# Patient Record
Sex: Female | Born: 1965 | Race: Black or African American | Hispanic: No | Marital: Married | Smoking: Never smoker
Health system: Southern US, Community
[De-identification: ages and names within clinical notes are randomized; demographics above are authoritative.]

## PROBLEM LIST (undated history)

## (undated) DIAGNOSIS — K259 Gastric ulcer, unspecified as acute or chronic, without hemorrhage or perforation: Secondary | ICD-10-CM

## (undated) DIAGNOSIS — IMO0002 Reserved for concepts with insufficient information to code with codable children: Secondary | ICD-10-CM

## (undated) DIAGNOSIS — K219 Gastro-esophageal reflux disease without esophagitis: Secondary | ICD-10-CM

## (undated) HISTORY — PX: TUBAL LIGATION: SHX77

---

## 1997-08-17 ENCOUNTER — Emergency Department (HOSPITAL_COMMUNITY): Admission: EM | Admit: 1997-08-17 | Discharge: 1997-08-17 | Payer: Self-pay | Admitting: Emergency Medicine

## 1997-09-17 ENCOUNTER — Emergency Department (HOSPITAL_COMMUNITY): Admission: EM | Admit: 1997-09-17 | Discharge: 1997-09-17 | Payer: Self-pay | Admitting: Emergency Medicine

## 1998-03-18 ENCOUNTER — Emergency Department (HOSPITAL_COMMUNITY): Admission: EM | Admit: 1998-03-18 | Discharge: 1998-03-18 | Payer: Self-pay | Admitting: Emergency Medicine

## 1999-10-03 ENCOUNTER — Emergency Department (HOSPITAL_COMMUNITY): Admission: EM | Admit: 1999-10-03 | Discharge: 1999-10-03 | Payer: Self-pay | Admitting: Emergency Medicine

## 1999-10-04 ENCOUNTER — Emergency Department (HOSPITAL_COMMUNITY): Admission: EM | Admit: 1999-10-04 | Discharge: 1999-10-04 | Payer: Self-pay | Admitting: Emergency Medicine

## 1999-10-08 ENCOUNTER — Inpatient Hospital Stay (HOSPITAL_COMMUNITY): Admission: EM | Admit: 1999-10-08 | Discharge: 1999-10-08 | Payer: Self-pay | Admitting: Emergency Medicine

## 1999-10-09 ENCOUNTER — Encounter: Admission: RE | Admit: 1999-10-09 | Discharge: 1999-10-09 | Payer: Self-pay | Admitting: Internal Medicine

## 1999-10-12 ENCOUNTER — Encounter: Admission: RE | Admit: 1999-10-12 | Discharge: 1999-10-12 | Payer: Self-pay | Admitting: Internal Medicine

## 1999-11-05 ENCOUNTER — Encounter: Admission: RE | Admit: 1999-11-05 | Discharge: 1999-11-05 | Payer: Self-pay | Admitting: Hematology and Oncology

## 1999-11-19 ENCOUNTER — Encounter: Admission: RE | Admit: 1999-11-19 | Discharge: 1999-11-19 | Payer: Self-pay | Admitting: Hematology and Oncology

## 2000-02-18 ENCOUNTER — Encounter: Admission: RE | Admit: 2000-02-18 | Discharge: 2000-02-18 | Payer: Self-pay | Admitting: Hematology and Oncology

## 2000-05-12 ENCOUNTER — Other Ambulatory Visit: Admission: RE | Admit: 2000-05-12 | Discharge: 2000-05-12 | Payer: Self-pay | Admitting: Internal Medicine

## 2000-05-12 ENCOUNTER — Encounter: Admission: RE | Admit: 2000-05-12 | Discharge: 2000-05-12 | Payer: Self-pay | Admitting: Internal Medicine

## 2000-07-21 ENCOUNTER — Encounter: Admission: RE | Admit: 2000-07-21 | Discharge: 2000-07-21 | Payer: Self-pay | Admitting: Hematology and Oncology

## 2001-04-04 ENCOUNTER — Emergency Department (HOSPITAL_COMMUNITY): Admission: EM | Admit: 2001-04-04 | Discharge: 2001-04-04 | Payer: Self-pay

## 2001-08-29 ENCOUNTER — Emergency Department (HOSPITAL_COMMUNITY): Admission: EM | Admit: 2001-08-29 | Discharge: 2001-08-29 | Payer: Self-pay | Admitting: Emergency Medicine

## 2001-09-25 ENCOUNTER — Encounter: Admission: RE | Admit: 2001-09-25 | Discharge: 2001-09-25 | Payer: Self-pay | Admitting: Internal Medicine

## 2001-09-29 ENCOUNTER — Emergency Department (HOSPITAL_COMMUNITY): Admission: EM | Admit: 2001-09-29 | Discharge: 2001-09-29 | Payer: Self-pay | Admitting: Podiatry

## 2001-10-09 ENCOUNTER — Other Ambulatory Visit: Admission: RE | Admit: 2001-10-09 | Discharge: 2001-10-09 | Payer: Self-pay | Admitting: Internal Medicine

## 2001-10-09 ENCOUNTER — Encounter: Admission: RE | Admit: 2001-10-09 | Discharge: 2001-10-09 | Payer: Self-pay | Admitting: Internal Medicine

## 2001-10-16 ENCOUNTER — Emergency Department (HOSPITAL_COMMUNITY): Admission: EM | Admit: 2001-10-16 | Discharge: 2001-10-16 | Payer: Self-pay | Admitting: Emergency Medicine

## 2001-10-18 ENCOUNTER — Encounter: Admission: RE | Admit: 2001-10-18 | Discharge: 2001-10-18 | Payer: Self-pay | Admitting: Internal Medicine

## 2001-12-29 ENCOUNTER — Encounter: Admission: RE | Admit: 2001-12-29 | Discharge: 2001-12-29 | Payer: Self-pay | Admitting: Internal Medicine

## 2002-01-05 ENCOUNTER — Ambulatory Visit (HOSPITAL_COMMUNITY): Admission: RE | Admit: 2002-01-05 | Discharge: 2002-01-05 | Payer: Self-pay | Admitting: Internal Medicine

## 2002-01-05 ENCOUNTER — Encounter: Admission: RE | Admit: 2002-01-05 | Discharge: 2002-01-05 | Payer: Self-pay | Admitting: Internal Medicine

## 2002-01-05 ENCOUNTER — Encounter: Payer: Self-pay | Admitting: Internal Medicine

## 2002-06-26 ENCOUNTER — Emergency Department (HOSPITAL_COMMUNITY): Admission: EM | Admit: 2002-06-26 | Discharge: 2002-06-26 | Payer: Self-pay | Admitting: Emergency Medicine

## 2002-06-26 ENCOUNTER — Encounter: Payer: Self-pay | Admitting: Emergency Medicine

## 2002-07-26 ENCOUNTER — Encounter: Admission: RE | Admit: 2002-07-26 | Discharge: 2002-07-26 | Payer: Self-pay | Admitting: Internal Medicine

## 2002-11-15 ENCOUNTER — Encounter: Admission: RE | Admit: 2002-11-15 | Discharge: 2002-11-15 | Payer: Self-pay | Admitting: Obstetrics and Gynecology

## 2002-11-22 ENCOUNTER — Ambulatory Visit (HOSPITAL_COMMUNITY): Admission: RE | Admit: 2002-11-22 | Discharge: 2002-11-22 | Payer: Self-pay | Admitting: *Deleted

## 2003-01-04 ENCOUNTER — Encounter: Admission: RE | Admit: 2003-01-04 | Discharge: 2003-01-04 | Payer: Self-pay | Admitting: Family Medicine

## 2003-03-12 ENCOUNTER — Emergency Department (HOSPITAL_COMMUNITY): Admission: EM | Admit: 2003-03-12 | Discharge: 2003-03-12 | Payer: Self-pay | Admitting: Emergency Medicine

## 2003-08-08 ENCOUNTER — Emergency Department (HOSPITAL_COMMUNITY): Admission: EM | Admit: 2003-08-08 | Discharge: 2003-08-08 | Payer: Self-pay | Admitting: Emergency Medicine

## 2004-04-06 ENCOUNTER — Emergency Department (HOSPITAL_COMMUNITY): Admission: EM | Admit: 2004-04-06 | Discharge: 2004-04-06 | Payer: Self-pay | Admitting: Emergency Medicine

## 2004-06-15 ENCOUNTER — Emergency Department (HOSPITAL_COMMUNITY): Admission: EM | Admit: 2004-06-15 | Discharge: 2004-06-15 | Payer: Self-pay | Admitting: Emergency Medicine

## 2004-08-20 ENCOUNTER — Emergency Department (HOSPITAL_COMMUNITY): Admission: EM | Admit: 2004-08-20 | Discharge: 2004-08-20 | Payer: Self-pay | Admitting: Emergency Medicine

## 2004-10-23 ENCOUNTER — Emergency Department (HOSPITAL_COMMUNITY): Admission: EM | Admit: 2004-10-23 | Discharge: 2004-10-23 | Payer: Self-pay | Admitting: Emergency Medicine

## 2004-10-30 ENCOUNTER — Encounter: Admission: RE | Admit: 2004-10-30 | Discharge: 2004-10-30 | Payer: Self-pay | Admitting: Occupational Medicine

## 2005-12-17 ENCOUNTER — Emergency Department (HOSPITAL_COMMUNITY): Admission: EM | Admit: 2005-12-17 | Discharge: 2005-12-17 | Payer: Self-pay | Admitting: Emergency Medicine

## 2006-04-04 ENCOUNTER — Emergency Department (HOSPITAL_COMMUNITY): Admission: EM | Admit: 2006-04-04 | Discharge: 2006-04-04 | Payer: Self-pay | Admitting: Emergency Medicine

## 2007-02-22 ENCOUNTER — Emergency Department (HOSPITAL_COMMUNITY): Admission: EM | Admit: 2007-02-22 | Discharge: 2007-02-22 | Payer: Self-pay | Admitting: Emergency Medicine

## 2007-06-14 ENCOUNTER — Emergency Department (HOSPITAL_COMMUNITY): Admission: EM | Admit: 2007-06-14 | Discharge: 2007-06-14 | Payer: Self-pay | Admitting: Emergency Medicine

## 2007-08-01 ENCOUNTER — Emergency Department (HOSPITAL_COMMUNITY): Admission: EM | Admit: 2007-08-01 | Discharge: 2007-08-01 | Payer: Self-pay | Admitting: Emergency Medicine

## 2007-10-24 ENCOUNTER — Emergency Department (HOSPITAL_COMMUNITY): Admission: EM | Admit: 2007-10-24 | Discharge: 2007-10-24 | Payer: Self-pay | Admitting: Emergency Medicine

## 2008-09-18 ENCOUNTER — Emergency Department (HOSPITAL_COMMUNITY): Admission: EM | Admit: 2008-09-18 | Discharge: 2008-09-19 | Payer: Self-pay | Admitting: Emergency Medicine

## 2009-03-10 ENCOUNTER — Observation Stay (HOSPITAL_COMMUNITY): Admission: EM | Admit: 2009-03-10 | Discharge: 2009-03-10 | Payer: Self-pay | Admitting: Emergency Medicine

## 2009-03-10 ENCOUNTER — Ambulatory Visit: Payer: Self-pay | Admitting: Cardiology

## 2010-06-12 ENCOUNTER — Emergency Department (HOSPITAL_COMMUNITY)
Admission: EM | Admit: 2010-06-12 | Discharge: 2010-06-12 | Disposition: A | Discharge status: Home / Self Care | Payer: Self-pay | Attending: Emergency Medicine | Admitting: Emergency Medicine

## 2010-06-12 DIAGNOSIS — IMO0002 Reserved for concepts with insufficient information to code with codable children: Secondary | ICD-10-CM | POA: Insufficient documentation

## 2010-06-12 DIAGNOSIS — X58XXXA Exposure to other specified factors, initial encounter: Secondary | ICD-10-CM | POA: Insufficient documentation

## 2010-06-12 DIAGNOSIS — M25559 Pain in unspecified hip: Secondary | ICD-10-CM | POA: Insufficient documentation

## 2010-08-05 LAB — CK TOTAL AND CKMB (NOT AT ARMC)
CK, MB: 1.3 ng/mL (ref 0.3–4.0)
Relative Index: 0.9 (ref 0.0–2.5)
Relative Index: 0.9 (ref 0.0–2.5)
Total CK: 152 U/L (ref 7–177)

## 2010-08-05 LAB — POCT I-STAT, CHEM 8
BUN: 9 mg/dL (ref 6–23)
Chloride: 104 mEq/L (ref 96–112)
Glucose, Bld: 92 mg/dL (ref 70–99)
Potassium: 3.7 mEq/L (ref 3.5–5.1)
Sodium: 140 mEq/L (ref 135–145)

## 2010-08-05 LAB — POCT CARDIAC MARKERS
CKMB, poc: <1 ng/mL — ABNORMAL LOW (ref 1.0–8.0)
CKMB, poc: <1 ng/mL — ABNORMAL LOW (ref 1.0–8.0)
Myoglobin, poc: 15.3 ng/mL (ref 12–200)
Troponin i, poc: <0.05 ng/mL (ref 0.00–0.09)
Troponin i, poc: <0.05 ng/mL (ref 0.00–0.09)

## 2010-08-05 LAB — CBC
Hemoglobin: 11.2 g/dL — ABNORMAL LOW (ref 12.0–15.0)
MCHC: 33.1 g/dL (ref 30.0–36.0)
MCV: 76.3 fL — ABNORMAL LOW (ref 78.0–100.0)
RBC: 4.44 MIL/uL (ref 3.87–5.11)
WBC: 8.6 10*3/uL (ref 4.0–10.5)

## 2010-08-05 LAB — DIFFERENTIAL
Basophils Relative: 1 % (ref 0–1)
Eosinophils Absolute: 0.2 10*3/uL (ref 0.0–0.7)
Lymphs Abs: 3.1 10*3/uL (ref 0.7–4.0)
Monocytes Absolute: 0.7 10*3/uL (ref 0.1–1.0)
Monocytes Relative: 9 % (ref 3–12)
Neutro Abs: 4.4 10*3/uL (ref 1.7–7.7)
Neutrophils Relative %: 51 % (ref 43–77)

## 2010-08-05 LAB — URINALYSIS, ROUTINE W REFLEX MICROSCOPIC
Glucose, UA: NEGATIVE mg/dL
Hgb urine dipstick: NEGATIVE
Nitrite: NEGATIVE
Protein, ur: NEGATIVE mg/dL
Urobilinogen, UA: 1 mg/dL (ref 0.0–1.0)
pH: 6 (ref 5.0–8.0)

## 2010-08-05 LAB — POCT PREGNANCY, URINE: Preg Test, Ur: NEGATIVE

## 2010-08-05 LAB — APTT: aPTT: 27 seconds (ref 24–37)

## 2010-08-05 LAB — PROTIME-INR: INR: 0.94 (ref 0.00–1.49)

## 2010-08-05 LAB — TROPONIN I
Troponin I: 0.01 ng/mL (ref 0.00–0.06)
Troponin I: 0.01 ng/mL (ref 0.00–0.06)

## 2010-08-11 LAB — URINALYSIS, ROUTINE W REFLEX MICROSCOPIC
Glucose, UA: NEGATIVE mg/dL
Nitrite: NEGATIVE
Urobilinogen, UA: 0.2 mg/dL (ref 0.0–1.0)

## 2010-08-11 LAB — DIFFERENTIAL
Basophils Absolute: 0.1 10*3/uL (ref 0.0–0.1)
Basophils Relative: 1 % (ref 0–1)
Monocytes Relative: 6 % (ref 3–12)
Neutro Abs: 5.7 10*3/uL (ref 1.7–7.7)
Neutrophils Relative %: 65 % (ref 43–77)

## 2010-08-11 LAB — COMPREHENSIVE METABOLIC PANEL
ALT: 14 U/L (ref 0–35)
AST: 17 U/L (ref 0–37)
Albumin: 3.3 g/dL — ABNORMAL LOW (ref 3.5–5.2)
BUN: 9 mg/dL (ref 6–23)
Chloride: 106 mEq/L (ref 96–112)
Glucose, Bld: 98 mg/dL (ref 70–99)
Total Protein: 6.8 g/dL (ref 6.0–8.3)

## 2010-08-11 LAB — CBC
MCHC: 32.8 g/dL (ref 30.0–36.0)
RDW: 14.5 % (ref 11.5–15.5)

## 2010-09-18 NOTE — Group Therapy Note (Signed)
NAME:  Terri Erickson, Terri Erickson                        ACCOUNT NO.:  [id number]   MEDICAL RECORD NO.:  [id number]                   PATIENT TYPE:  EMS   LOCATION:  [facility]                           FACILITY:  [facility]   PHYSICIAN:  Tinnie Gens, M.D.                   DATE OF BIRTH:  June 19, 1965   DATE OF SERVICE:  11/15/2002                                    CLINIC NOTE   SUBJECTIVE:  Terri Erickson is a 45 year old African-American female presenting  to the gynecologic clinic today for her yearly Pap smear.  The patient  denies a history of abnormal Pap smears.  Her last Pap smear was  approximately one year previously.  The patient is complaining today of a  two-week history of right lower quadrant pain.  No trauma or heavy lifting  recently.  The patient has experienced pain with standing.  Pain can be as  severe as 7/10 in intensity.  No fevers, no vomiting, diarrhea, no  constipation.  Last bowel movement was this morning and was normal.  No  change in activity level recently.  No vaginal discharge.  Pain does occur  daily.  The patient has been taking ibuprofen, which helps the pain mildly.  The patient does have intermittent pain with intercourse.  The patient does  have a history of ovarian cysts prior to her pregnancies.   The patient also complained of irregular periods.  The patient will have a  period and then not have one for two to three months.  This has been  occurring for several years and is somewhat bothersome to the patient  because she is unable to predict when her next period will be.  The patient  is interested in some type of oral therapy that may help with regulation of  her periods.   PAST MEDICAL HISTORY:  1. Peptic ulcer disease by report.  2. History of ovarian cysts prior to her two pregnancies.  3. G2, P2-0-0-2, status post C-sections x2.  4. Bilateral tubal ligation in 1992.  5. Irregular periods since her tubal ligation in 1992.   MEDICATIONS:  None.   ALLERGIES:  TOMATOES, which cause a rash.   SOCIAL HISTORY:  The patient is single.  She lives with her two daughters,  who are 72 and 9 years old.  She works at [employer] in [employer].  No  smoking, alcohol, or drugs.  The patient is currently sexually active and  reports she has three to five sexual partners in her whole life.  No history  of sexually transmitted diseases.   FAMILY HISTORY:  Her mother is 37 years old with diabetes and hypertension,  diet.  Father is 82 years old with allergic rhinitis.  Brother is 18 years  old with seizures.  There is no family history of breast cancer or ovarian  cancer.   OBJECTIVE:  VITAL SIGNS:  Blood pressure is 118/61, pulse is  54, weight is  184 pounds.  Height is 5 feet 4 inches.  GENERAL:  A well-developed, well-nourished female in no acute distress.  NECK:  Supple without thyromegaly.  Positive obese.  CARDIAC:  Regular rate and rhythm, no murmurs, gallops, or rubs.  CHEST:  Lungs are clear to auscultation bilaterally.  BREASTS:  Without discharge bilateral nipples, without abnormalities  bilateral breasts.  No axillary lymphadenopathy.  ABDOMEN:  Soft, nontender, nondistended, positive bowel sounds, normoactive.  There is mild tenderness to palpation in the right lower quadrant, however  no guarding, no rebound.  EXTREMITIES:  No edema.  PELVIC:  External genitalia is within normal limits.  Cervix is well-  visualized with scant bleeding from the os.  Pap smear was performed on  today's visit.  No cervical motion tenderness.  No masses appreciated with  palpation of the adnexa bilaterally.   ASSESSMENT AND PLAN:  1. Health maintenance.  The patient is status post Pap smear today.     Recommendation to continue monthly breast exams, and she should initiate     a baseline mammogram at age 35.  2. Right lower quadrant pain.  Differential diagnosis appears to be     musculoskeletal strain versus ovarian cyst.  No evidence of infectious      etiology at this time.  Will schedule the patient for a pelvic ultrasound     to rule out ovarian cyst.  The patient is in agreement.  3. Irregular menses.  The patient is interested in initiating OCPs to help     with regularity of her menses.  The patient was provided with two samples     of Ortho Tri-Cyclen Lo on today's visit.  She will follow up in two     months to follow up on the efficacy of this.     Nilda Simmer, MD                          Tinnie Gens, M.D.    KS/MEDQ  D:  11/15/2002  T:  11/16/2002  Job:  [id number]

## 2010-09-18 NOTE — Group Therapy Note (Signed)
   NAME:  Terri Erickson, Terri Erickson                        ACCOUNT NO.:  [id number]   MEDICAL RECORD NO.:  [id number]                   PATIENT TYPE:  OUT   LOCATION:  [facility]                           FACILITY:  [facility]   PHYSICIAN:  Tinnie Gens, MD                     DATE OF BIRTH:  Jan 20, 1966   DATE OF SERVICE:  01/04/2003                                    CLINIC NOTE   HISTORY:  This is a 45 year old multiparous female who is presenting today  with a chief concern of headaches following a visit here on November 15, 2002.  At that time she was started on Ortho Tri-Cyclen Lo for regulation of  menses.  She has taken them as directed until yesterday when she stopped  taking them after calling here and advised to come in.  She is describing a  history of chronic headaches that have been present since the first week of  August similar to headaches she has had in the past.  She describes them as  being bitemporal, throbbing, severe enough to interfere with activities.  No  nausea, vomiting, visual disturbance, or neurologic symptoms other than  headache.  She states they last day and night and she takes Advil with  limited effectiveness.  The headache recurs in one to two hours after taking  the Advil.  Also during the last month she had begun to have a lot of stress  at home, particularly with her 35 year old daughter.  She works full-time  and also has some relationship stress.  In addition, she reports having  occasional hot flashes for years which still are occasional, though she has  been on the Ortho Tri-Cyclen.  LMP was December 12, 2002.  She reports no  further GI symptoms and right lower quadrant pain resolved from July 2004.  ROS is otherwise negative.  Exam is deferred.   ASSESSMENT:  Probable tension headache.   PLAN:  In discussion with Dr. Shawnie Pons decided to dispense with the Ortho Tri-  Cyclen Lo and to keep a menstrual history.  Discussed situational stress at  length including need  to bring in her 45 year old for family planning care  or oral contraceptives as needed.  Discussed stress relief, neck massage,  local heat, relaxation.  She was also given a prescription for Percocet  5/325 one to two p.o. q.4-6h. p.r.n. if ibuprofen is ineffective.  She was  given #6 with refills x1.  She is to come back for follow-up in six months  and to call sooner if she has any problems.     Sharolyn Douglas, CNM                           Tinnie Gens, MD    DP/MEDQ  D:  01/04/2003  T:  01/04/2003  Job:  [id number]

## 2011-02-10 LAB — CBC
RBC: 4.83
WBC: 7.6

## 2011-02-10 LAB — DIFFERENTIAL
Lymphocytes Relative: 34
Lymphs Abs: 2.6
Monocytes Relative: 7
Neutro Abs: 4.3
Neutrophils Relative %: 57

## 2011-02-10 LAB — POCT CARDIAC MARKERS
CKMB, poc: 1.1
Myoglobin, poc: 37.7

## 2011-03-08 ENCOUNTER — Emergency Department (INDEPENDENT_AMBULATORY_CARE_PROVIDER_SITE_OTHER)
Admission: EM | Admit: 2011-03-08 | Discharge: 2011-03-08 | Disposition: A | Discharge status: Home / Self Care | Payer: No Typology Code available for payment source | Source: Home / Self Care | Attending: Family Medicine | Admitting: Family Medicine

## 2011-03-08 ENCOUNTER — Emergency Department (HOSPITAL_COMMUNITY)
Admission: EM | Admit: 2011-03-08 | Discharge: 2011-03-08 | Disposition: A | Discharge status: Home / Self Care | Payer: Self-pay | Source: Home / Self Care

## 2011-03-08 ENCOUNTER — Encounter: Payer: Self-pay | Admitting: Emergency Medicine

## 2011-03-08 DIAGNOSIS — J069 Acute upper respiratory infection, unspecified: Secondary | ICD-10-CM

## 2011-03-08 MED ORDER — PSEUDOEPHEDRINE-GUAIFENESIN ER 120-1200 MG PO TB12: 120.0000 mg | ORAL_TABLET | Freq: Two times a day (BID) | ORAL | Status: DC

## 2011-03-08 MED ORDER — AMOXICILLIN 500 MG PO CAPS: 500.0000 mg | ORAL_CAPSULE | Freq: Three times a day (TID) | ORAL | Status: AC

## 2011-03-08 MED ORDER — IPRATROPIUM BROMIDE 0.06 % NA SOLN: 2.0000 | Freq: Four times a day (QID) | NASAL | Status: AC

## 2011-03-08 NOTE — ED Provider Notes (Addendum)
History     CSN: [id number] Arrival date & time: 03/08/2011  7:26 PM   First MD Initiated Contact with Patient 03/08/11 1945      Chief Complaint  Patient presents with  . URI    (Consider location/radiation/quality/duration/timing/severity/associated sxs/prior treatment) Patient is a 45 y.o. female presenting with URI. The history is provided by the patient.  URI The primary symptoms include headaches, sore throat and cough. Primary symptoms do not include fever. The current episode started more than 1 week ago. This is a new problem. The problem has been gradually worsening.  Symptoms associated with the illness include sinus pressure, congestion and rhinorrhea.    History reviewed. No pertinent past medical history.  Past Surgical History  Procedure Date  . Tubal ligation   . Cesarean section     History reviewed. No pertinent family history.  History  Substance Use Topics  . Smoking status: Never Smoker   . Smokeless tobacco: Not on file  . Alcohol Use: No    OB History    Grav Para Term Preterm Abortions TAB SAB Ect Mult Living                  Review of Systems  Constitutional: Negative for fever.  HENT: Positive for nosebleeds, congestion, sore throat, rhinorrhea, postnasal drip and sinus pressure.   Eyes: Positive for discharge.  Respiratory: Positive for cough. Negative for shortness of breath.   Neurological: Positive for headaches.    Allergies  Review of patient's allergies indicates no known allergies.  Home Medications   Current Outpatient Rx  Name Route Sig Dispense Refill  . NAPROXEN 500 MG PO TABS Oral Take 1 tablet (500 mg total) by mouth 2 (two) times daily. 30 tablet 0    BP 125/73  Pulse 70  Temp(Src) 99.2 F (37.3 C) (Oral)  SpO2 98%  LMP 02/18/2011  Physical Exam  Nursing note and vitals reviewed. Constitutional: She appears well-developed and well-nourished.  HENT:  Head: Normocephalic.  Right Ear: Tympanic membrane and  ear canal normal.  Left Ear: Tympanic membrane and ear canal normal.  Nose: Mucosal edema and rhinorrhea present. Right sinus exhibits maxillary sinus tenderness. Left sinus exhibits maxillary sinus tenderness.  Mouth/Throat: Oropharynx is clear and moist and mucous membranes are normal.  Eyes: Pupils are equal, round, and reactive to light. Right eye exhibits discharge.  Neck: Normal range of motion. Neck supple.  Pulmonary/Chest: Effort normal and breath sounds normal.    ED Course  Procedures (including critical care time)  Labs Reviewed - No data to display No results found.   1. URI (upper respiratory infection)       MDM          Barkley Bruns, MD 03/08/11 1958  Linna Hoff, MD 08/27/11 1255

## 2011-03-08 NOTE — ED Notes (Signed)
Pt here with sore throat that started last week with worsening sx cough,congestion,sinus pressure.pt denies pain.

## 2011-03-08 NOTE — ED Notes (Signed)
Pt called x1

## 2011-07-05 ENCOUNTER — Emergency Department (HOSPITAL_COMMUNITY): Payer: No Typology Code available for payment source

## 2011-07-05 ENCOUNTER — Encounter (HOSPITAL_COMMUNITY): Payer: Self-pay | Admitting: Emergency Medicine

## 2011-07-05 ENCOUNTER — Emergency Department (HOSPITAL_COMMUNITY)
Admission: EM | Admit: 2011-07-05 | Discharge: 2011-07-05 | Disposition: A | Discharge status: Home / Self Care | Payer: No Typology Code available for payment source | Attending: Emergency Medicine | Admitting: Emergency Medicine

## 2011-07-05 DIAGNOSIS — Y92410 Unspecified street and highway as the place of occurrence of the external cause: Secondary | ICD-10-CM | POA: Insufficient documentation

## 2011-07-05 DIAGNOSIS — R079 Chest pain, unspecified: Secondary | ICD-10-CM | POA: Insufficient documentation

## 2011-07-05 DIAGNOSIS — T148XXA Other injury of unspecified body region, initial encounter: Secondary | ICD-10-CM

## 2011-07-05 DIAGNOSIS — M549 Dorsalgia, unspecified: Secondary | ICD-10-CM | POA: Insufficient documentation

## 2011-07-05 MED ORDER — HYDROCODONE-ACETAMINOPHEN 5-325 MG PO TABS
2.0000 | ORAL_TABLET | Freq: Once | ORAL | Status: AC
  Administered 2011-07-05: 2 via ORAL
  Filled 2011-07-05: qty 2

## 2011-07-05 MED ORDER — CYCLOBENZAPRINE HCL 10 MG PO TABS: 10.0000 mg | ORAL_TABLET | Freq: Three times a day (TID) | ORAL | Status: AC | PRN

## 2011-07-05 MED ORDER — NAPROXEN 500 MG PO TABS: 500.0000 mg | ORAL_TABLET | Freq: Two times a day (BID) | ORAL | Status: DC
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
## 2011-07-06 NOTE — ED Provider Notes (Signed)
Medical screening examination/treatment/procedure(s) were conducted as a shared visit with non-physician practitioner(s) and myself.  I personally evaluated the patient during the encounter   Suzi Roots, MD 07/06/11 1436

## 2011-10-07 ENCOUNTER — Emergency Department (HOSPITAL_COMMUNITY): Payer: Self-pay

## 2011-10-07 ENCOUNTER — Emergency Department (HOSPITAL_COMMUNITY)
Admission: EM | Admit: 2011-10-07 | Discharge: 2011-10-07 | Disposition: A | Discharge status: Home / Self Care | Payer: Self-pay | Attending: Emergency Medicine | Admitting: Emergency Medicine

## 2011-10-07 ENCOUNTER — Encounter (HOSPITAL_COMMUNITY): Payer: Self-pay | Admitting: *Deleted

## 2011-10-07 DIAGNOSIS — R1013 Epigastric pain: Secondary | ICD-10-CM | POA: Insufficient documentation

## 2011-10-07 DIAGNOSIS — R079 Chest pain, unspecified: Secondary | ICD-10-CM | POA: Insufficient documentation

## 2011-10-07 HISTORY — DX: Gastro-esophageal reflux disease without esophagitis: K21.9

## 2011-10-07 LAB — CBC
MCH: 23.8 pg — ABNORMAL LOW (ref 26.0–34.0)
Platelets: 356 10*3/uL (ref 150–400)
RBC: 5.13 MIL/uL — ABNORMAL HIGH (ref 3.87–5.11)
WBC: 10.4 10*3/uL (ref 4.0–10.5)

## 2011-10-07 LAB — BASIC METABOLIC PANEL
CO2: 22 mEq/L (ref 19–32)
Calcium: 9.2 mg/dL (ref 8.4–10.5)
Chloride: 105 mEq/L (ref 96–112)
Sodium: 137 mEq/L (ref 135–145)

## 2011-10-07 LAB — LIPASE, BLOOD: Lipase: 27 U/L (ref 11–59)

## 2011-10-07 LAB — POCT I-STAT TROPONIN I: Troponin i, poc: 0 ng/mL (ref 0.00–0.08)

## 2011-10-07 LAB — HEPATIC FUNCTION PANEL
AST: 17 U/L (ref 0–37)
Bilirubin, Direct: <0.1 mg/dL (ref 0.0–0.3)

## 2011-10-07 MED ORDER — FAMOTIDINE 20 MG PO TABS
20.0000 mg | ORAL_TABLET | Freq: Once | ORAL | Status: AC
  Administered 2011-10-07: 20 mg via ORAL
  Filled 2011-10-07: qty 1

## 2011-10-07 MED ORDER — GI COCKTAIL ~~LOC~~
30.0000 mL | Freq: Once | ORAL | Status: AC
  Administered 2011-10-07: 30 mL via ORAL
  Filled 2011-10-07: qty 30

## 2011-10-07 MED ORDER — OMEPRAZOLE 20 MG PO CPDR: 20.0000 mg | DELAYED_RELEASE_CAPSULE | Freq: Every day | ORAL | Status: DC

## 2011-10-07 NOTE — ED Notes (Signed)
States GI cocktail "didn't help"

## 2011-10-07 NOTE — ED Provider Notes (Addendum)
History     CSN: [id number]  Arrival date & time 10/07/11  1455   First MD Initiated Contact with Patient 10/07/11 [phone]      Chief Complaint  Patient presents with  . Abdominal Pain  . Chest Pain    (Consider location/radiation/quality/duration/timing/severity/associated sxs/prior treatment) HPI Comments: Patient presents with epigastric pain that began 4 days ago.  She notes that it's been relatively constant over the last few days.  Nothing specific makes it better or worse.  She states she's had less deep because she's not known once been causing her pain.  She has been able to keep without increase in the pain or nausea or vomiting.  She's had no fevers.  No cough or chest pain or shortness of breath.  She's had normal bowel movements.  She does have a known history of GERD but is not taking any medications for that at this time.  She does not smoke.  The history is provided by the patient.    Past Medical History  Diagnosis Date  . GERD (gastroesophageal reflux disease)     Past Surgical History  Procedure Date  . Tubal ligation   . Cesarean section     History reviewed. No pertinent family history.  History  Substance Use Topics  . Smoking status: Never Smoker   . Smokeless tobacco: Not on file  . Alcohol Use: No    OB History    Grav Para Term Preterm Abortions TAB SAB Ect Mult Living                  Review of Systems  Constitutional: Negative.  Negative for fever and chills.  HENT: Negative.   Eyes: Negative.   Respiratory: Negative.  Negative for cough and shortness of breath.   Cardiovascular: Negative.  Negative for chest pain.  Gastrointestinal: Positive for abdominal pain. Negative for nausea, vomiting and diarrhea.  Genitourinary: Negative.  Negative for dysuria.  Musculoskeletal: Negative.  Negative for back pain.  Skin: Negative.  Negative for color change and rash.  Neurological: Negative.  Negative for syncope and headaches.  Hematological:  Negative.  Negative for adenopathy.  Psychiatric/Behavioral: Negative.  Negative for confusion.  All other systems reviewed and are negative.    Allergies  Review of patient's allergies indicates no known allergies.  Home Medications   Current Outpatient Rx  Name Route Sig Dispense Refill  . IBUPROFEN 200 MG PO TABS Oral Take 400 mg by mouth every 6 (six) hours as needed. For pain      BP 120/54  Pulse 64  Temp(Src) 98.4 F (36.9 C) (Oral)  Resp 16  Ht 5\' 4"  (1.626 m)  SpO2 100%  LMP 09/06/2011  Physical Exam  Nursing note and vitals reviewed. Constitutional: She is oriented to person, place, and time. She appears well-developed and well-nourished.  Non-toxic appearance. She does not have a sickly appearance.  HENT:  Head: Normocephalic and atraumatic.  Eyes: Conjunctivae, EOM and lids are normal. Pupils are equal, round, and reactive to light. No scleral icterus.  Neck: Trachea normal and normal range of motion. Neck supple.  Cardiovascular: Normal rate, regular rhythm and normal heart sounds.   Pulmonary/Chest: Effort normal and breath sounds normal.  Abdominal: Soft. Normal appearance. There is tenderness. There is no rebound, no guarding and no CVA tenderness.       Mild epigastric tenderness on exam  Musculoskeletal: Normal range of motion.  Neurological: She is alert and oriented to person, place, and time. She  has normal strength.  Skin: Skin is warm, dry and intact. No rash noted.  Psychiatric: She has a normal mood and affect. Her behavior is normal. Judgment and thought content normal.    ED Course  Procedures (including critical care time)  Results for orders placed during the hospital encounter of 10/07/11  CBC      Component Value Range   WBC 10.4  4.0 - 10.5 (K/uL)   RBC 5.13 (*) 3.87 - 5.11 (MIL/uL)   Hemoglobin 12.2  12.0 - 15.0 (g/dL)   HCT 09.8  11.9 - 14.7 (%)   MCV 74.1 (*) 78.0 - 100.0 (fL)   MCH 23.8 (*) 26.0 - 34.0 (pg)   MCHC 32.1  30.0 -  36.0 (g/dL)   RDW 82.9 (*) 56.2 - 15.5 (%)   Platelets 356  150 - 400 (K/uL)  BASIC METABOLIC PANEL      Component Value Range   Sodium 137  135 - 145 (mEq/L)   Potassium 4.1  3.5 - 5.1 (mEq/L)   Chloride 105  96 - 112 (mEq/L)   CO2 22  19 - 32 (mEq/L)   Glucose, Bld 73  70 - 99 (mg/dL)   BUN 9  6 - 23 (mg/dL)   Creatinine, Ser 1.30  0.50 - 1.10 (mg/dL)   Calcium 9.2  8.4 - 86.5 (mg/dL)   GFR calc non Af Amer 85 (*) >90 (mL/min)   GFR calc Af Amer >90  >90 (mL/min)  POCT I-STAT TROPONIN I      Component Value Range   Troponin i, poc 0.00  0.00 - 0.08 (ng/mL)   Comment 3           HEPATIC FUNCTION PANEL      Component Value Range   Total Protein 7.6  6.0 - 8.3 (g/dL)   Albumin 3.6  3.5 - 5.2 (g/dL)   AST 17  0 - 37 (U/L)   ALT 13  0 - 35 (U/L)   Alkaline Phosphatase 48  39 - 117 (U/L)   Total Bilirubin 0.2 (*) 0.3 - 1.2 (mg/dL)   Bilirubin, Direct <7.8  0.0 - 0.3 (mg/dL)   Indirect Bilirubin NOT CALCULATED  0.3 - 0.9 (mg/dL)  LIPASE, BLOOD      Component Value Range   Lipase 27  11 - 59 (U/L)   Dg Chest 2 View  10/07/2011  *RADIOLOGY REPORT*  Clinical Data: Chest and upper abdominal pain for the past 5 days.  CHEST - 2 VIEW  Comparison: 07/05/2011.  Findings: Normal sized heart.  Clear lungs with normal vascularity. Mild scoliosis and mild thoracic spine degenerative changes.  IMPRESSION: No acute abnormality.  Original Report Authenticated By: Darrol Angel, M.D.      Date: 10/07/2011  Rate: 59  Rhythm: sinus bradycardia  QRS Axis: normal  Intervals: normal  ST/T Wave abnormalities: normal  Conduction Disutrbances:none  Narrative Interpretation:   Old EKG Reviewed: none available    MDM  Patient with symptoms that did not appear to be consistent with ACS at this time.  Her symptoms are more consistent with GERD or gastric ulcers.  There would be some consideration for pancreatitis given the location of her pain in her epigastrium.  I will add on liver function tests  and lipase to help further assess for this.  Patient will be given a GI cocktail to see if this helps with her symptoms.   Nat Christen, MD 10/07/11 1617  Patient feels slightly better after the  GI cocktail but she does not have complete relief.  Her LFTs and lipase are normal so this does not appear to be hepatitis, pancreatitis or cholecystitis.  Patient symptoms are still most consistent with gastritis, GERD or gastric ulcers.  I recommended that she start on a acid reflux medication.  I will also give her information for primary care physician and a GI physician.  Nat Christen, MD 10/07/11 [phone]

## 2011-10-07 NOTE — ED Notes (Signed)
Patient reports onset of epigastric, substernal chest pain since Sunday.  She states she thought it was indigestion.  She took advil w/o relief. Patient states this is a different pain.  She is tender in her upper abd

## 2011-10-07 NOTE — Discharge Instructions (Signed)
RESOURCE GUIDE  Chronic Pain Problems: Contact [facility]  [phone] Patients need to be referred by their primary care doctor.  Insufficient Money for Medicine: Contact United Way:  call "211" or [facility] [phone].  No Primary Care Doctor: - Call [facility]  [phone] - can help you locate a primary care doctor that  accepts your insurance, provides certain services, etc. - Physician Referral Service- 1-800-533-3463  Agencies that provide inexpensive medical care: - [facility]  [phone] - [facility]  [phone] - [facility]  [phone] - [facility]  [phone] - Planned Parenthood  [phone] - [facility]  [phone]  Medicaid-accepting [place] Providers: - [facility]- [address]  [phone], Mon-Fri 9am-7pm, Sat 9am-1pm - [facility]- [address], [address]  [phone] - [facility]- [address], [address]  [phone] - [facility]- [address] [place] [address]  [phone] - Veita Bland- [address], [phone]  Only accepts [place] Access Medicaid patients after they have their name  applied to their card  Self Pay (no insurance) in [place]: - Sickle Cell Patients: Dr Eric Dean, [facility]  [address], [phone] - [facility]- [address]  [phone]       -     [facility]- [address]       -     [facility]- see information above (Speak to Pam H if you do not have insurance)       -  [facility]- [address], [phone]       -  [facility] [place]- [address],  [phone]       -  [facility]- [address] [place] [address], [phone]       -  Dr Osei-Bonsu-  [address], [place], [phone]       -  [facility]- [address], [phone]       -  [facility]- [address] [place] [address], [phone], also [address], [phone]       -    [facility]- [address], [phone], 1st & 3rd Saturday   every month, 10am-1pm  1) Find a Doctor and Pay Out of Pocket Although you won't have to find out who is covered by your insurance plan, it is a good idea to ask around and get recommendations. You will then need to call the office and see if the doctor you have chosen will accept you as a new patient and what types of options they offer for patients who are self-pay. Some doctors offer discounts or will set up payment plans for their patients who do not have insurance, but you will need to ask so you aren't surprised when you get to your appointment.  2) Contact Your Local Health Department Not all health departments have doctors that can see patients for sick visits, but many do, so it is worth a call to see if yours does. If you don't know where your local health department is, you can check in your phone book. The CDC also has a tool to help you locate your state's health department, and many state websites also have   listings of all of their local health departments.  3) Find a Walk-in Clinic If your illness is not likely to be very severe or complicated, you may want to try a walk in clinic. These are popping up all over the country in pharmacies, drugstores, and shopping centers. They're usually staffed by nurse practitioners or physician assistants that have been trained to treat common illnesses and complaints. They're usually fairly quick and inexpensive. However, if you have serious medical issues or chronic medical problems, these are probably not your best option  STD Testing - [place] [facility] [place], [facility] Clinic, [address], [place], phone [phone] or 1-877-539-9860.  Monday - Friday, call for an appointment. - [place]  [facility] [place], [facility] Clinic, [address], [place], phone [phone] or 1-877-539-9860.  Monday - Friday, call for an appointment.  Abuse/Neglect: - [place] Child Abuse Hotline (336) 641-3795 - [place] Child Abuse Hotline 800-378-5315 (After Hours)  Emergency Shelter:  [facility] (336) 271-5985  Maternity Homes: - [facility] (336) 275-9566 - [facility] (704) 372-4663  MRSA Hotline #:   832-7006  [place] Resources  [facility] [place]  [employer] [place] [facility] [address]                 [address]         [address]  [place]                                               [place]                              [place] Phone:  [phone]                                  Phone:  [phone]                   Phone:  [phone]  [place] [facility], [phone] - [place] Services - [facility]- 1-888-581-9988       -     [facility] in [place], [address],                                  336-349-4454, Insurance  [place] Child Abuse Hotline (336) 342-1394 or (336) 342-3537 (After Hours)   Behavioral Health Services  Substance Abuse Resources: - [facility]  336-882-2125 - [facility] 336-784-9470 - [facility] 336-285-9073 - [facility] 336-845-3988 - Residential & Outpatient Substance Abuse Program  800-659-3381  Psychological Services: -  Health  [phone] - [facility]  [phone] - [place] [facility], [address], [place], ACCESS LINE: 1-800-853-5163 or 336-641-4981, Http://www.[facility].com/services/adult.htm  Dental Assistance  If unable to pay or uninsured, contact:  [facility] or [place] [facility] to become qualified for the adult dental  clinic.  Patients with Medicaid:  [facility] [address], [phone], [phone]  If unable   to pay, or uninsured, contact [facility] 517-349-2669) or [facility] 302-153-4685 in [place], [phone] in [facility]) to become qualified for the adult dental clinic  Other Low-Cost Community Dental Services: - [facility]- [address] [place], [place], [place], [address], [phone], [address]. [address], 2nd and 4th Thursday of the month at 6:30am.  10 clients each day by appointment, can sometimes see walk-in patients if someone does not show for an appointment. [facility]- [address] [place], [place], [address], [phone] - [facility]- [address], [place], [place], [address], [phone] [facility]- [phone] [facility]- [phone] [facility] Department380 [phone] Gastritis Gastritis is an inflammation (the body's way of reacting to injury and/or infection) of the stomach. It is often caused by viral or bacterial (germ) infections. It can also be caused by chemicals (including alcohol) and medications. This illness may be associated with generalized malaise (feeling tired, not well), cramps, and fever. The illness may last 2 to 7 days. If symptoms of gastritis continue, gastroscopy (looking into the stomach with a telescope-like instrument), biopsy (taking tissue samples), and/or blood tests may be necessary to determine the cause. Antibiotics will not affect the illness unless there is a bacterial infection present. One common bacterial cause of gastritis is an organism known as H. Pylori. This can be treated with antibiotics. Other forms of gastritis are caused by too much acid in the stomach. They can be treated with medications such as H2 blockers and antacids. Home treatment is usually all that is needed. Young children will quickly become  dehydrated (loss of body fluids) if vomiting and diarrhea are both present. Medications may be given to control nausea. Medications are usually not given for diarrhea unless especially bothersome. Some medications slow the removal of the virus from the gastrointestinal tract. This slows down the healing process. HOME CARE INSTRUCTIONS Home care instructions for nausea and vomiting:  For adults: drink small amounts of fluids often. Drink at least 2 quarts a day. Take sips frequently. Do not drink large amounts of fluid at one time. This may worsen the nausea.   Only take over-the-counter or prescription medicines for pain, discomfort, or fever as directed by your caregiver.   Drink clear liquids only. Those are anything you can see through such as water, broth, or soft drinks.   Once you are keeping clear liquids down, you may start full liquids, soups, juices, and ice cream or sherbet. Slowly add bland (plain, not spicy) foods to your diet.  Home care instructions for diarrhea:  Diarrhea can be caused by bacterial infections or a virus. Your condition should improve with time, rest, fluids, and/or anti-diarrheal medication.   Until your diarrhea is under control, you should drink clear liquids often in small amounts. Clear liquids include: water, broth, jell-o water and weak tea.  Avoid:  Milk.   Fruits.   Tobacco.   Alcohol.   Extremely hot or cold fluids.   Too much intake of anything at one time.  When your diarrhea stops you may add the following foods, which help the stool to become more formed:  Rice.   Bananas.   Apples without skin.   Dry toast.  Once these foods are tolerated you may add low-fat yogurt and low-fat cottage cheese. They will help to restore the normal bacterial balance in your bowel. Wash your hands well to avoid spreading bacteria (germ) or virus. SEEK IMMEDIATE MEDICAL CARE IF:  You are unable to keep fluids down.   Vomiting or diarrhea become  persistent (constant).   Abdominal pain develops, increases, or localizes. (Right sided pain can be appendicitis. Left sided pain in adults can be diverticulitis.)   You develop a fever (an oral temperature above 102 F (38.9 C)).   Diarrhea becomes excessive or contains blood or mucus.   You have excessive weakness, dizziness, fainting or extreme thirst.   You are not improving or you are getting worse.   You have any other questions or concerns.  Document Released: 04/13/2001 Document Revised: 04/08/2011 Document Reviewed: 04/19/2005 [facility] Patient Information 2012 [place], [place].Gastroesophageal Reflux Disease, Adult Gastroesophageal reflux disease (GERD) happens when acid from your stomach flows up into the esophagus. When acid comes in contact with the esophagus, the acid causes soreness (inflammation) in the esophagus. Over time, GERD may create small holes (ulcers) in the lining of the esophagus. CAUSES   Increased body weight. This puts pressure on the stomach, making acid rise from the stomach into the esophagus.   Smoking. This increases acid production in the stomach.   Drinking alcohol. This causes decreased pressure in the lower esophageal sphincter (valve or ring of muscle between the esophagus and stomach), allowing acid from the stomach into the esophagus.   Late evening meals and a full stomach. This increases pressure and acid production in the stomach.   A malformed lower esophageal sphincter.  Sometimes, no cause is found. SYMPTOMS   Burning pain in the lower part of the mid-chest behind the breastbone and in the mid-stomach area. This may occur twice a week or more often.   Trouble swallowing.   Sore throat.   Dry cough.   Asthma-like symptoms including chest tightness, shortness of breath, or wheezing.  DIAGNOSIS  Your caregiver may be able to diagnose GERD based on your symptoms. In some cases, X-rays and other tests may be done to check for  complications or to check the condition of your stomach and esophagus. TREATMENT  Your caregiver may recommend over-the-counter or prescription medicines to help decrease acid production. Ask your caregiver before starting or adding any new medicines.  HOME CARE INSTRUCTIONS   Change the factors that you can control. Ask your caregiver for guidance concerning weight loss, quitting smoking, and alcohol consumption.   Avoid foods and drinks that make your symptoms worse, such as:   Caffeine or alcoholic drinks.   Chocolate.   Peppermint or mint flavorings.   Garlic and onions.   Spicy foods.   Citrus fruits, such as oranges, lemons, or limes.   Tomato-based foods such as sauce, chili, salsa, and pizza.   Fried and fatty foods.   Avoid lying down for the 3 hours prior to your bedtime or prior to taking a nap.   Eat small, frequent meals instead of large meals.   Wear loose-fitting clothing. Do not wear anything tight around your waist that causes pressure on your stomach.   Raise the head of your bed 6 to 8 inches with wood blocks to help you sleep. Extra pillows will not help.   Only take over-the-counter or prescription medicines for pain, discomfort, or fever as directed by your caregiver.   Do not take aspirin, ibuprofen, or other nonsteroidal anti-inflammatory drugs (NSAIDs).  SEEK IMMEDIATE MEDICAL CARE IF:   You have pain in your arms, neck, jaw, teeth, or back.   Your pain increases or changes in intensity or duration.   You develop nausea, vomiting, or sweating (diaphoresis).  You develop shortness of breath, or you faint.   Your vomit is green, yellow, black, or looks like coffee grounds or blood.   Your stool is red, bloody, or black.  These symptoms could be signs of other problems, such as heart disease, gastric bleeding, or esophageal bleeding. MAKE SURE YOU:   Understand these instructions.   Will watch your condition.   Will get help right away if  you are not doing well or get worse.  Document Released: 01/27/2005 Document Revised: 04/08/2011 Document Reviewed: 11/06/2010 [facility] Patient Information 2012 [place], [place].

## 2011-10-07 NOTE — ED Notes (Signed)
Onset of epigastric pain, nonrad., interm. x5 days ago; no associated symptoms;  Last BM last PM; however, reports loose stool

## 2011-12-04 ENCOUNTER — Emergency Department (HOSPITAL_COMMUNITY)
Admission: EM | Admit: 2011-12-04 | Discharge: 2011-12-05 | Disposition: A | Discharge status: Home / Self Care | Payer: Self-pay | Attending: Emergency Medicine | Admitting: Emergency Medicine

## 2011-12-04 ENCOUNTER — Encounter (HOSPITAL_COMMUNITY): Payer: Self-pay | Admitting: *Deleted

## 2011-12-04 DIAGNOSIS — K219 Gastro-esophageal reflux disease without esophagitis: Secondary | ICD-10-CM | POA: Insufficient documentation

## 2011-12-04 DIAGNOSIS — J40 Bronchitis, not specified as acute or chronic: Secondary | ICD-10-CM | POA: Insufficient documentation

## 2011-12-04 DIAGNOSIS — Z79899 Other long term (current) drug therapy: Secondary | ICD-10-CM | POA: Insufficient documentation

## 2011-12-04 DIAGNOSIS — R070 Pain in throat: Secondary | ICD-10-CM | POA: Insufficient documentation

## 2011-12-04 LAB — RAPID STREP SCREEN (MED CTR MEBANE ONLY): Streptococcus, Group A Screen (Direct): NEGATIVE

## 2011-12-04 NOTE — ED Notes (Signed)
The pt has had a rt earache sorethroat and and a headache  For one week.  Low grade temp

## 2011-12-05 MED ORDER — ALBUTEROL SULFATE HFA 108 (90 BASE) MCG/ACT IN AERS
2.0000 | INHALATION_SPRAY | RESPIRATORY_TRACT | Status: DC | PRN
  Administered 2011-12-05: 2 via RESPIRATORY_TRACT
  Filled 2011-12-05: qty 6.7

## 2011-12-05 MED ORDER — ALBUTEROL SULFATE (5 MG/ML) 0.5% IN NEBU
5.0000 mg | INHALATION_SOLUTION | Freq: Once | RESPIRATORY_TRACT | Status: AC
  Administered 2011-12-05: 5 mg via RESPIRATORY_TRACT
  Filled 2011-12-05: qty 1

## 2011-12-05 MED ORDER — IPRATROPIUM BROMIDE 0.02 % IN SOLN
0.5000 mg | Freq: Once | RESPIRATORY_TRACT | Status: AC
Start: 2011-12-05 — End: 2011-12-05
  Administered 2011-12-05: 0.5 mg via RESPIRATORY_TRACT
  Filled 2011-12-05: qty 2.5

## 2011-12-05 NOTE — ED Notes (Signed)
The patient is AOx4 and comfortable with her discharge instructions. 

## 2011-12-05 NOTE — ED Provider Notes (Signed)
History     CSN: [id number]  Arrival date & time 12/04/11  2246   First MD Initiated Contact with Patient 12/05/11 0025      Chief Complaint  Patient presents with  . Otalgia   HPI  History provided by the patient. Patient is a 46 year old female who presents with complaints of sore throat, right earache and cough for the past week. Patient states symptoms have been progressively worsening. Symptoms are associated with some nasal congestion and rhinorrhea. Patient also reports mild general headache and malaise. Patient reports having normal appetite with no nausea vomiting or diarrhea symptoms. Cough is occasionally productive of yellow phlegm. Patient reports subjective low grade fevers at home. Denies any night sweats. Patient denies any recent travel. No hemoptysis.    Past Medical History  Diagnosis Date  . GERD (gastroesophageal reflux disease)     Past Surgical History  Procedure Date  . Tubal ligation   . Cesarean section     No family history on file.  History  Substance Use Topics  . Smoking status: Never Smoker   . Smokeless tobacco: Not on file  . Alcohol Use: No    OB History    Grav Para Term Preterm Abortions TAB SAB Ect Mult Living                  Review of Systems  Constitutional: Positive for fever and fatigue. Negative for chills and appetite change.  HENT: Positive for ear pain, congestion and sore throat.   Respiratory: Positive for cough. Negative for shortness of breath.   Cardiovascular: Negative for chest pain.  Gastrointestinal: Negative for vomiting and diarrhea.  Musculoskeletal: Negative for myalgias.    Allergies  Review of patient's allergies indicates no known allergies.  Home Medications   Current Outpatient Rx  Name Route Sig Dispense Refill  . NAPROXEN SODIUM 220 MG PO TABS Oral Take 440 mg by mouth 2 (two) times daily as needed. For pain    . OMEPRAZOLE 20 MG PO CPDR Oral Take 20 mg by mouth daily as needed. For heartburn       BP 106/38  Pulse 61  Temp 97.9 F (36.6 C) (Oral)  Resp 17  Ht 5\' 4"  (1.626 m)  Wt 196 lb (88.905 kg)  BMI 33.64 kg/m2  SpO2 100%  LMP 12/04/2011  Physical Exam  Nursing note and vitals reviewed. Constitutional: She is oriented to person, place, and time. She appears well-developed and well-nourished. No distress.  HENT:  Head: Normocephalic.  Right Ear: Tympanic membrane normal.  Left Ear: Tympanic membrane normal.       Erythema of pharynx. No exudates. Uvula midline.  Neck: Normal range of motion. Neck supple.  Cardiovascular: Normal rate and regular rhythm.   Pulmonary/Chest: Effort normal and breath sounds normal.  Abdominal: Soft.  Lymphadenopathy:    She has no cervical adenopathy.  Neurological: She is alert and oriented to person, place, and time.  Skin: Skin is warm and dry.  Psychiatric: She has a normal mood and affect. Her behavior is normal.    ED Course  Procedures   Results for orders placed during the hospital encounter of 12/04/11  RAPID STREP SCREEN      Component Value Range   Streptococcus, Group A Screen (Direct) NEGATIVE  NEGATIVE        1. Bronchitis       MDM  12:50 AM patient seen and evaluated. Strep test negative. Patient with wheezing on exam. Symptoms consistent with  viral bronchitis infection.   Patient reports having improvement after breathing treatment. Patient discharged home with albuterol inhaler.     Angus Seller, [place] 12/05/11 [phone]

## 2011-12-05 NOTE — ED Provider Notes (Signed)
Medical screening examination/treatment/procedure(s) were performed by non-physician practitioner and as supervising physician I was immediately available for consultation/collaboration.   Gavin Pound. Oletta Lamas, MD 12/05/11 [phone]

## 2012-06-25 ENCOUNTER — Encounter (HOSPITAL_COMMUNITY): Payer: Self-pay | Admitting: Emergency Medicine

## 2012-06-25 ENCOUNTER — Emergency Department (HOSPITAL_COMMUNITY)
Admission: EM | Admit: 2012-06-25 | Discharge: 2012-06-25 | Disposition: A | Discharge status: Home / Self Care | Payer: Self-pay | Attending: Emergency Medicine | Admitting: Emergency Medicine

## 2012-06-25 DIAGNOSIS — Z8719 Personal history of other diseases of the digestive system: Secondary | ICD-10-CM | POA: Insufficient documentation

## 2012-06-25 DIAGNOSIS — R42 Dizziness and giddiness: Secondary | ICD-10-CM | POA: Insufficient documentation

## 2012-06-25 DIAGNOSIS — R109 Unspecified abdominal pain: Secondary | ICD-10-CM

## 2012-06-25 DIAGNOSIS — A5903 Trichomonal cystitis and urethritis: Secondary | ICD-10-CM | POA: Insufficient documentation

## 2012-06-25 DIAGNOSIS — K219 Gastro-esophageal reflux disease without esophagitis: Secondary | ICD-10-CM | POA: Insufficient documentation

## 2012-06-25 DIAGNOSIS — N390 Urinary tract infection, site not specified: Secondary | ICD-10-CM | POA: Insufficient documentation

## 2012-06-25 DIAGNOSIS — R350 Frequency of micturition: Secondary | ICD-10-CM | POA: Insufficient documentation

## 2012-06-25 DIAGNOSIS — D649 Anemia, unspecified: Secondary | ICD-10-CM | POA: Insufficient documentation

## 2012-06-25 DIAGNOSIS — R300 Dysuria: Secondary | ICD-10-CM | POA: Insufficient documentation

## 2012-06-25 DIAGNOSIS — R1013 Epigastric pain: Secondary | ICD-10-CM | POA: Insufficient documentation

## 2012-06-25 DIAGNOSIS — Z3202 Encounter for pregnancy test, result negative: Secondary | ICD-10-CM | POA: Insufficient documentation

## 2012-06-25 DIAGNOSIS — R630 Anorexia: Secondary | ICD-10-CM | POA: Insufficient documentation

## 2012-06-25 DIAGNOSIS — R51 Headache: Secondary | ICD-10-CM | POA: Insufficient documentation

## 2012-06-25 DIAGNOSIS — Z9889 Other specified postprocedural states: Secondary | ICD-10-CM | POA: Insufficient documentation

## 2012-06-25 DIAGNOSIS — Z9851 Tubal ligation status: Secondary | ICD-10-CM | POA: Insufficient documentation

## 2012-06-25 DIAGNOSIS — Z79899 Other long term (current) drug therapy: Secondary | ICD-10-CM | POA: Insufficient documentation

## 2012-06-25 HISTORY — DX: Reserved for concepts with insufficient information to code with codable children: IMO0002

## 2012-06-25 LAB — CBC WITH DIFFERENTIAL/PLATELET
Basophils Absolute: 0 10*3/uL (ref 0.0–0.1)
Basophils Relative: 0 % (ref 0–1)
Eosinophils Relative: 3 % (ref 0–5)
HCT: 34.6 % — ABNORMAL LOW (ref 36.0–46.0)
MCH: 22.9 pg — ABNORMAL LOW (ref 26.0–34.0)
MCHC: 31.5 g/dL (ref 30.0–36.0)
MCV: 72.8 fL — ABNORMAL LOW (ref 78.0–100.0)
Monocytes Absolute: 0.6 10*3/uL (ref 0.1–1.0)
RDW: 16.3 % — ABNORMAL HIGH (ref 11.5–15.5)

## 2012-06-25 LAB — COMPREHENSIVE METABOLIC PANEL
AST: 15 U/L (ref 0–37)
Albumin: 3.3 g/dL — ABNORMAL LOW (ref 3.5–5.2)
Calcium: 9.1 mg/dL (ref 8.4–10.5)
Creatinine, Ser: 0.87 mg/dL (ref 0.50–1.10)
GFR calc non Af Amer: 78 mL/min — ABNORMAL LOW (ref >90)

## 2012-06-25 LAB — URINALYSIS, ROUTINE W REFLEX MICROSCOPIC
Ketones, ur: NEGATIVE mg/dL
Nitrite: NEGATIVE
Specific Gravity, Urine: 1.038 — ABNORMAL HIGH (ref 1.005–1.030)
Urobilinogen, UA: 1 mg/dL (ref 0.0–1.0)
pH: 5.5 (ref 5.0–8.0)

## 2012-06-25 LAB — URINE MICROSCOPIC-ADD ON

## 2012-06-25 LAB — POCT PREGNANCY, URINE: Preg Test, Ur: NEGATIVE

## 2012-06-25 LAB — LIPASE, BLOOD: Lipase: 29 U/L (ref 11–59)

## 2012-06-25 MED ORDER — LANSOPRAZOLE 30 MG PO CPDR: 30.0000 mg | DELAYED_RELEASE_CAPSULE | Freq: Every day | ORAL | Status: DC

## 2012-06-25 MED ORDER — SULFAMETHOXAZOLE-TRIMETHOPRIM 800-160 MG PO TABS: 1.0000 | ORAL_TABLET | Freq: Two times a day (BID) | ORAL | Status: DC

## 2012-06-25 MED ORDER — METRONIDAZOLE 500 MG PO TABS: 500.0000 mg | ORAL_TABLET | Freq: Two times a day (BID) | ORAL | Status: DC

## 2012-06-25 NOTE — ED Notes (Signed)
C/o mid upper abd pain, headache, and sore throat x 2 weeks.  C/o L ear pain since Tuesday.  Pt relates abd pain to ulcer.  Denies nausea, vomiting, sob.  Reports frequent urination and burning with urination x 2 days.

## 2012-06-25 NOTE — ED Notes (Signed)
Pt states that she has been having abdominal pain for 2 weeks. Pt states that the pain is located in the upper abdomen. Pt states eating does not bother the pain. Pt states that the pain is better now than when she first came in. Pt states she felt nauseated last week. Pt states denies problem with bowel or urine. Pt denies blood in stools or urine or vaginal bleeding.

## 2012-06-25 NOTE — ED Provider Notes (Signed)
History     CSN: [id number]  Arrival date & time 06/25/12  2038   First MD Initiated Contact with Patient 06/25/12 2254      Chief Complaint  Patient presents with  . Abdominal Pain    (Consider location/radiation/quality/duration/timing/severity/associated sxs/prior treatment) Patient is a 47 y.o. female presenting with abdominal pain.  Abdominal Pain Pain location:  Epigastric Associated symptoms: dysuria   Associated symptoms: no chest pain, no diarrhea, no nausea, no shortness of breath, no vaginal discharge and no vomiting    patient presents with a dull and constant epigastric pain for last 2 weeks. No fevers. It may be worse with eating. She states she's had a decreased appetite. No nausea vomiting diarrhea. She has had some dysuria and frequency. She states she had had a headache for about a week before the abdominal pain started she had been taking Motrin for it. She states she had been taking Motrin for the abdominal pain also. She's had previous episodes like this. She states she was told she had an ulcer at that time. No weight loss. No blood in stool. No dark stools. Patient states she does occasionally feel lightheaded if she stands.  Past Medical History  Diagnosis Date  . GERD (gastroesophageal reflux disease)   . Ulcer     Past Surgical History  Procedure Laterality Date  . Tubal ligation    . Cesarean section      No family history on file.  History  Substance Use Topics  . Smoking status: Never Smoker   . Smokeless tobacco: Not on file  . Alcohol Use: No    OB History   Grav Para Term Preterm Abortions TAB SAB Ect Mult Living                  Review of Systems  Constitutional: Negative for activity change and appetite change.  HENT: Negative for neck stiffness.   Eyes: Negative for pain.  Respiratory: Negative for chest tightness and shortness of breath.   Cardiovascular: Negative for chest pain and leg swelling.  Gastrointestinal: Positive  for abdominal pain. Negative for nausea, vomiting and diarrhea.  Genitourinary: Positive for dysuria. Negative for flank pain, vaginal discharge and vaginal pain.  Musculoskeletal: Negative for back pain.  Skin: Negative for rash.  Neurological: Positive for light-headedness and headaches. Negative for weakness and numbness.  Psychiatric/Behavioral: Negative for behavioral problems.    Allergies  Review of patient's allergies indicates no known allergies.  Home Medications   Current Outpatient Rx  Name  Route  Sig  Dispense  Refill  . acetaminophen (TYLENOL) 500 MG tablet   Oral   Take 1,000 mg by mouth daily as needed for pain. FOR PAIN         . ibuprofen (ADVIL,MOTRIN) 200 MG tablet   Oral   Take 400 mg by mouth 2 (two) times daily as needed for pain. FOR PAIN         . lansoprazole (PREVACID) 30 MG capsule   Oral   Take 1 capsule (30 mg total) by mouth daily.   14 capsule   0   . metroNIDAZOLE (FLAGYL) 500 MG tablet   Oral   Take 1 tablet (500 mg total) by mouth 2 (two) times daily.   14 tablet   0   . sulfamethoxazole-trimethoprim (BACTRIM DS,SEPTRA DS) 800-160 MG per tablet   Oral   Take 1 tablet by mouth 2 (two) times daily.   6 tablet   0  BP 114/66  Pulse 59  Temp(Src) 98.6 F (37 C) (Oral)  Resp 16  SpO2 100%  LMP 05/28/2012  Physical Exam  Nursing note and vitals reviewed. Constitutional: She is oriented to person, place, and time. She appears well-developed and well-nourished.  HENT:  Head: Normocephalic and atraumatic.  Eyes: EOM are normal. Pupils are equal, round, and reactive to light.  Neck: Normal range of motion. Neck supple.  Cardiovascular: Normal rate, regular rhythm and normal heart sounds.   No murmur heard. Pulmonary/Chest: Effort normal and breath sounds normal. No respiratory distress. She has no wheezes. She has no rales.  Abdominal: Soft. Bowel sounds are normal. She exhibits no distension. There is no tenderness. There  is no rebound and no guarding.  Musculoskeletal: Normal range of motion.  Neurological: She is alert and oriented to person, place, and time. No cranial nerve deficit.  Skin: Skin is warm and dry.  Psychiatric: She has a normal mood and affect. Her speech is normal.    ED Course  Procedures (including critical care time)  Labs Reviewed  CBC WITH DIFFERENTIAL - Abnormal; Notable for the following:    Hemoglobin 10.9 (*)    HCT 34.6 (*)    MCV 72.8 (*)    MCH 22.9 (*)    RDW 16.3 (*)    All other components within normal limits  COMPREHENSIVE METABOLIC PANEL - Abnormal; Notable for the following:    Glucose, Bld 104 (*)    Albumin 3.3 (*)    Total Bilirubin 0.1 (*)    GFR calc non Af Amer 78 (*)    All other components within normal limits  URINALYSIS, ROUTINE W REFLEX MICROSCOPIC - Abnormal; Notable for the following:    Color, Urine AMBER (*)    APPearance TURBID (*)    Specific Gravity, Urine 1.038 (*)    Bilirubin Urine SMALL (*)    Leukocytes, UA SMALL (*)    All other components within normal limits  URINE MICROSCOPIC-ADD ON - Abnormal; Notable for the following:    Squamous Epithelial / LPF FEW (*)    Bacteria, UA MANY (*)    Crystals CA OXALATE CRYSTALS (*)    All other components within normal limits  URINE CULTURE  LIPASE, BLOOD  POCT PREGNANCY, URINE  POCT I-STAT TROPONIN I   No results found.   1. Abdominal pain   2. UTI (urinary tract infection)   3. Trichomonal urethritis      Date: 06/25/2012  Rate: 67  Rhythm: normal sinus rhythm  QRS Axis: normal  Intervals: normal  ST/T Wave abnormalities: normal  Conduction Disutrbances: none  Narrative Interpretation: unremarkable     MDM  Patient with epigastric pain for last 2 weeks. No weight loss. No blood in stools. Also dysuria. History of same that she was told was ulcers. EKG is reassuring. Lab work is reassuring. She has a mild anemia but will need to be followed. She also has urinary tract  infection and trichomonas in the urine. Patient be treated for UTI and Trichomonas. She'll also be started on Prevacid. She'll followup with [facility]. He'll also find a primary care physician.        Juliet Rude. Rubin Payor, MD 06/25/12 [phone]

## 2012-06-27 LAB — URINE CULTURE

## 2013-01-01 ENCOUNTER — Emergency Department (HOSPITAL_COMMUNITY)
Admission: EM | Admit: 2013-01-01 | Discharge: 2013-01-01 | Disposition: A | Discharge status: Home / Self Care | Payer: Self-pay | Attending: Emergency Medicine | Admitting: Emergency Medicine

## 2013-01-01 ENCOUNTER — Emergency Department (HOSPITAL_COMMUNITY): Payer: Self-pay

## 2013-01-01 ENCOUNTER — Encounter (HOSPITAL_COMMUNITY): Payer: Self-pay | Admitting: *Deleted

## 2013-01-01 DIAGNOSIS — R1013 Epigastric pain: Secondary | ICD-10-CM | POA: Insufficient documentation

## 2013-01-01 DIAGNOSIS — K219 Gastro-esophageal reflux disease without esophagitis: Secondary | ICD-10-CM | POA: Insufficient documentation

## 2013-01-01 DIAGNOSIS — R197 Diarrhea, unspecified: Secondary | ICD-10-CM | POA: Insufficient documentation

## 2013-01-01 DIAGNOSIS — R112 Nausea with vomiting, unspecified: Secondary | ICD-10-CM | POA: Insufficient documentation

## 2013-01-01 DIAGNOSIS — Z79899 Other long term (current) drug therapy: Secondary | ICD-10-CM | POA: Insufficient documentation

## 2013-01-01 DIAGNOSIS — Z3202 Encounter for pregnancy test, result negative: Secondary | ICD-10-CM | POA: Insufficient documentation

## 2013-01-01 DIAGNOSIS — Z8719 Personal history of other diseases of the digestive system: Secondary | ICD-10-CM | POA: Insufficient documentation

## 2013-01-01 LAB — COMPREHENSIVE METABOLIC PANEL
AST: 21 U/L (ref 0–37)
Alkaline Phosphatase: 44 U/L (ref 39–117)
BUN: 7 mg/dL (ref 6–23)
CO2: 21 mEq/L (ref 19–32)
Chloride: 102 mEq/L (ref 96–112)
Creatinine, Ser: 0.8 mg/dL (ref 0.50–1.10)
GFR calc non Af Amer: 86 mL/min — ABNORMAL LOW (ref >90)
Potassium: 4 mEq/L (ref 3.5–5.1)
Total Bilirubin: 0.1 mg/dL — ABNORMAL LOW (ref 0.3–1.2)

## 2013-01-01 LAB — CBC WITH DIFFERENTIAL/PLATELET
Basophils Absolute: 0 10*3/uL (ref 0.0–0.1)
HCT: 34.7 % — ABNORMAL LOW (ref 36.0–46.0)
Hemoglobin: 11.2 g/dL — ABNORMAL LOW (ref 12.0–15.0)
Lymphocytes Relative: 38 % (ref 12–46)
Monocytes Absolute: 0.7 10*3/uL (ref 0.1–1.0)
Monocytes Relative: 7 % (ref 3–12)
Neutro Abs: 5 10*3/uL (ref 1.7–7.7)
WBC: 9.7 10*3/uL (ref 4.0–10.5)

## 2013-01-01 LAB — URINALYSIS, ROUTINE W REFLEX MICROSCOPIC
Bilirubin Urine: NEGATIVE
Glucose, UA: NEGATIVE mg/dL
Ketones, ur: NEGATIVE mg/dL
Protein, ur: NEGATIVE mg/dL

## 2013-01-01 LAB — POCT PREGNANCY, URINE: Preg Test, Ur: NEGATIVE

## 2013-01-01 LAB — URINE MICROSCOPIC-ADD ON

## 2013-01-01 MED ORDER — PROMETHAZINE HCL 25 MG PO TABS: 25.0000 mg | ORAL_TABLET | Freq: Four times a day (QID) | ORAL | Status: DC | PRN

## 2013-01-01 MED ORDER — FAMOTIDINE 20 MG PO TABS
20.0000 mg | ORAL_TABLET | Freq: Once | ORAL | Status: AC
  Administered 2013-01-01: 20 mg via ORAL
  Filled 2013-01-01: qty 1

## 2013-01-01 MED ORDER — GI COCKTAIL ~~LOC~~
30.0000 mL | Freq: Once | ORAL | Status: AC
  Administered 2013-01-01: 30 mL via ORAL
  Filled 2013-01-01: qty 30

## 2013-01-01 NOTE — ED Provider Notes (Signed)
CSN: [id number]     Arrival date & time 01/01/13  1849 History   First MD Initiated Contact with Patient 01/01/13 1903     Chief Complaint  Patient presents with  . Abdominal Pain  . Emesis  . Diarrhea   (Consider location/radiation/quality/duration/timing/severity/associated sxs/prior Treatment) HPI  Terri Erickson is a 47 y.o. female with past medical history significant for GERD and gastric ulcers complaining of epigastric pain associated with nonbloody, nonbilious, coffee-ground emesis intermittently worsening over the course of 7 days. He states the pain is exacerbated with eating and when she urinates. States that pain in the epigastrium is exacerbated by urinating, she denies dysuria or hematuria. Patient also endorses and intermittent diarrhea, denies hematochezia or melena. Patient is pain-free right now. She denies fever, sick contacts, chest pain, shortness of breath, back pain, numbness weakness.   Past Medical History  Diagnosis Date  . GERD (gastroesophageal reflux disease)   . Ulcer    Past Surgical History  Procedure Laterality Date  . Tubal ligation    . Cesarean section     No family history on file. History  Substance Use Topics  . Smoking status: Never Smoker   . Smokeless tobacco: Not on file  . Alcohol Use: No   OB History   Grav Para Term Preterm Abortions TAB SAB Ect Mult Living                 Review of Systems 10 systems reviewed and found to be negative, except as noted in the HPI  Allergies  Review of patient's allergies indicates no known allergies.  Home Medications   Current Outpatient Rx  Name  Route  Sig  Dispense  Refill  . promethazine (PHENERGAN) 25 MG tablet   Oral   Take 1 tablet (25 mg total) by mouth every 6 (six) hours as needed for nausea.   12 tablet   0    BP 112/58  Pulse 61  Temp(Src) 98.1 F (36.7 C) (Oral)  Resp 18  Ht 5\' 4"  (1.626 m)  SpO2 99%  LMP 12/18/2012 Physical Exam  Nursing note and vitals  reviewed. Constitutional: She is oriented to person, place, and time. She appears well-developed and well-nourished. No distress.  HENT:  Head: Normocephalic.  Mouth/Throat: Oropharynx is clear and moist.  Eyes: Conjunctivae and EOM are normal.  Neck: Normal range of motion.  Cardiovascular: Normal rate, regular rhythm and intact distal pulses.   Pulmonary/Chest: Effort normal and breath sounds normal. No stridor. No respiratory distress. She has no wheezes. She has no rales. She exhibits no tenderness.  Abdominal: Soft. She exhibits no distension and no mass. There is no tenderness. There is no rebound and no guarding.  Musculoskeletal: Normal range of motion. She exhibits no edema.  Neurological: She is alert and oriented to person, place, and time.  Psychiatric: She has a normal mood and affect.    ED Course  Procedures (including critical care time) Labs Review Labs Reviewed  CBC WITH DIFFERENTIAL - Abnormal; Notable for the following:    Hemoglobin 11.2 (*)    HCT 34.7 (*)    MCV 72.4 (*)    MCH 23.4 (*)    RDW 15.8 (*)    All other components within normal limits  COMPREHENSIVE METABOLIC PANEL - Abnormal; Notable for the following:    Sodium 134 (*)    Albumin 3.2 (*)    Total Bilirubin 0.1 (*)    GFR calc non Af Amer 86 (*)  All other components within normal limits  URINALYSIS, ROUTINE W REFLEX MICROSCOPIC - Abnormal; Notable for the following:    Leukocytes, UA TRACE (*)    All other components within normal limits  URINE MICROSCOPIC-ADD ON - Abnormal; Notable for the following:    Squamous Epithelial / LPF FEW (*)    All other components within normal limits  LIPASE, BLOOD  POCT I-STAT TROPONIN I  POCT PREGNANCY, URINE   Imaging Review No results found.  MDM   1. Nausea, vomiting and diarrhea   2. Epigastric pain    Filed Vitals:   01/01/13 1856 01/01/13 2138  BP: 122/78 112/58  Pulse: 63 61  Temp: 98.1 F (36.7 C)   TempSrc: Oral   Resp: 18    Height: 5\' 4"  (1.626 m)   SpO2: 100% 99%     Terri Erickson is a 47 y.o. female  With epigastric pain, nausea vomiting and diarrhea. This is likely secondary to a viral infection, also patient has a history of gastric ulcer, is not on any PPIs or H2 blockers, she has been taking Motrin for pain relief this is exacerbating the situation. Blood work is unremarkable and patient has no pneumoperitoneum on acute abdominal series. Serial abdominal exams remained benign.  Medications  famotidine (PEPCID) tablet 20 mg (20 mg Oral Given 01/01/13 2144)  gi cocktail (Maalox,Lidocaine,Donnatal) (30 mLs Oral Given 01/01/13 2145)    Pt is hemodynamically stable, appropriate for, and amenable to discharge at this time. Pt verbalized understanding and agrees with care plan. All questions answered. Outpatient follow-up and specific return precautions discussed.    Discharge Medication List as of 01/01/2013 10:13 PM    START taking these medications   Details  promethazine (PHENERGAN) 25 MG tablet Take 1 tablet (25 mg total) by mouth every 6 (six) hours as needed for nausea., Starting 01/01/2013, Until Discontinued, Print        Note: Portions of this report may have been transcribed using voice recognition software. Every effort was made to ensure accuracy; however, inadvertent computerized transcription errors may be present      Wynetta Emery, PA-C 01/01/13 2305  Wynetta Emery, PA-C 01/09/13 0003

## 2013-01-01 NOTE — ED Notes (Signed)
Pt c/o epigastric pain since Mon, accompanied by emesis and diarrhea. Pt states that pain increases with eating and diarrhea.  Pt states pain and emesis every time she eats.  Denies blood in emesis or feces.

## 2013-01-09 NOTE — ED Provider Notes (Signed)
Medical screening examination/treatment/procedure(s) were performed by non-physician practitioner and as supervising physician I was immediately available for consultation/collaboration. Devoria Albe, MD, Armando Gang   Ward Givens, MD 01/09/13 [phone]

## 2013-04-24 ENCOUNTER — Emergency Department (HOSPITAL_COMMUNITY): Payer: Self-pay

## 2013-04-24 ENCOUNTER — Encounter (HOSPITAL_COMMUNITY): Payer: Self-pay | Admitting: Emergency Medicine

## 2013-04-24 ENCOUNTER — Emergency Department (HOSPITAL_COMMUNITY)
Admission: EM | Admit: 2013-04-24 | Discharge: 2013-04-24 | Disposition: A | Discharge status: Home / Self Care | Payer: Self-pay | Attending: Emergency Medicine | Admitting: Emergency Medicine

## 2013-04-24 DIAGNOSIS — S6390XA Sprain of unspecified part of unspecified wrist and hand, initial encounter: Secondary | ICD-10-CM | POA: Insufficient documentation

## 2013-04-24 DIAGNOSIS — X500XXA Overexertion from strenuous movement or load, initial encounter: Secondary | ICD-10-CM | POA: Insufficient documentation

## 2013-04-24 DIAGNOSIS — Z872 Personal history of diseases of the skin and subcutaneous tissue: Secondary | ICD-10-CM | POA: Insufficient documentation

## 2013-04-24 DIAGNOSIS — S63602A Unspecified sprain of left thumb, initial encounter: Secondary | ICD-10-CM

## 2013-04-24 DIAGNOSIS — Y9389 Activity, other specified: Secondary | ICD-10-CM | POA: Insufficient documentation

## 2013-04-24 DIAGNOSIS — Y929 Unspecified place or not applicable: Secondary | ICD-10-CM | POA: Insufficient documentation

## 2013-04-24 DIAGNOSIS — Z8719 Personal history of other diseases of the digestive system: Secondary | ICD-10-CM | POA: Insufficient documentation

## 2013-04-24 MED ORDER — NAPROXEN 500 MG PO TABS: 500.0000 mg | ORAL_TABLET | Freq: Two times a day (BID) | ORAL | Status: DC

## 2013-04-24 NOTE — Progress Notes (Signed)
Orthopedic Tech Progress Note Patient Details:  Terri Erickson [birth year] [id number]  Ortho Devices Type of Ortho Device: Thumb velcro splint Ortho Device/Splint Location: LUE Ortho Device/Splint Interventions: Ordered;Application   Jennye Moccasin 04/24/2013, 10:17 PM

## 2013-04-24 NOTE — ED Notes (Signed)
Pt c/o pain in left thumb x's 1 week.  St's pain started after picking up her grandson.

## 2013-04-24 NOTE — ED Provider Notes (Signed)
CSN: [id number]     Arrival date & time 04/24/13  2048 History  This chart was scribed for non-physician practitioner Felicie Morn, NP working with Celene Kras, MD by Leone Payor, ED Scribe. This patient was seen in room TR06C/TR06C and the patient's care was started at 2048.    Chief Complaint  Patient presents with  . Hand Injury    The history is provided by the patient. No language interpreter was used.    HPI Comments: Terri Erickson is a 47 y.o. female who presents to the Emergency Department complaining of 1 week of constant, unchanged left thumb pain and mild swelling. She reports picking up her grandchild and may have hyperextended her left thumb. She has tried ice and OTC pain medication with relief. She denies numbness.   Past Medical History  Diagnosis Date  . GERD (gastroesophageal reflux disease)   . Ulcer    Past Surgical History  Procedure Laterality Date  . Tubal ligation    . Cesarean section     No family history on file. History  Substance Use Topics  . Smoking status: Never Smoker   . Smokeless tobacco: Not on file  . Alcohol Use: No   OB History   Grav Para Term Preterm Abortions TAB SAB Ect Mult Living                 Review of Systems  Musculoskeletal: Positive for arthralgias ( left thumb pain).  Neurological: Negative for weakness and numbness.  All other systems reviewed and are negative.    Allergies  Review of patient's allergies indicates no known allergies.  Home Medications  No current outpatient prescriptions on file. BP 125/73  Pulse 70  Temp(Src) 98.1 F (36.7 C) (Oral)  Resp 18  Ht 5\' 4"  (1.626 m)  Wt 192 lb 8 oz (87.317 kg)  BMI 33.03 kg/m2  SpO2 100%  LMP 03/25/2013 Physical Exam  Nursing note and vitals reviewed. Constitutional: She is oriented to person, place, and time. She appears well-developed and well-nourished.  HENT:  Head: Normocephalic and atraumatic.  Cardiovascular: Normal rate, regular rhythm and  normal heart sounds.   Pulmonary/Chest: Effort normal and breath sounds normal. No respiratory distress. She has no wheezes. She has no rales.  Abdominal: She exhibits no distension.  Musculoskeletal:  Tenderness and edema to the MCP joint of the left thumb. Limited ROM due to pain. Distal PMS intact.  Neurological: She is alert and oriented to person, place, and time.  Strength and sensation intact.   Skin: Skin is warm and dry.  Cap refill normal.   Psychiatric: She has a normal mood and affect.    ED Course  Procedures   DIAGNOSTIC STUDIES: Oxygen Saturation is 100% on RA, normal by my interpretation.    COORDINATION OF CARE: 10:06 PM Discussed treatment plan with pt at bedside and pt agreed to plan.   Labs Review Labs Reviewed - No data to display Imaging Review Dg Finger Thumb Left  04/24/2013   CLINICAL DATA:  Left thumb pain at the 1st MCP joint since an injury 2 weeks ago.  EXAM: LEFT THUMB 2+V  COMPARISON:  None.  FINDINGS: No fracture or dislocation. Mild dorsal spur formation at the 1st IP joint.  IMPRESSION: No fracture.   Electronically Signed   By: Gordan Payment M.D.   On: 04/24/2013 21:19    EKG Interpretation   None      Radiology results reviewed and shared with patient.  Thumb spica splint, NSAID.  Follow-up with orthopedics if no improvement MDM  Right thumb pain, likely hyperextension injury.   I personally performed the services described in this documentation, which was scribed in my presence. The recorded information has been reviewed and is accurate.   Jimmye Norman, NP 04/24/13 [phone]

## 2013-04-25 NOTE — ED Provider Notes (Signed)
Medical screening examination/treatment/procedure(s) were performed by non-physician practitioner and as supervising physician I was immediately available for consultation/collaboration.    Grafton Warzecha R Jinger Middlesworth, MD 04/25/13 1529 

## 2013-06-04 ENCOUNTER — Emergency Department (HOSPITAL_COMMUNITY)
Admission: EM | Admit: 2013-06-04 | Discharge: 2013-06-04 | Disposition: A | Discharge status: Home / Self Care | Payer: Self-pay | Attending: Emergency Medicine | Admitting: Emergency Medicine

## 2013-06-04 ENCOUNTER — Encounter (HOSPITAL_COMMUNITY): Payer: Self-pay | Admitting: Emergency Medicine

## 2013-06-04 ENCOUNTER — Emergency Department (HOSPITAL_COMMUNITY): Payer: Self-pay

## 2013-06-04 DIAGNOSIS — R112 Nausea with vomiting, unspecified: Secondary | ICD-10-CM

## 2013-06-04 DIAGNOSIS — R300 Dysuria: Secondary | ICD-10-CM | POA: Insufficient documentation

## 2013-06-04 DIAGNOSIS — R630 Anorexia: Secondary | ICD-10-CM | POA: Insufficient documentation

## 2013-06-04 DIAGNOSIS — B349 Viral infection, unspecified: Secondary | ICD-10-CM

## 2013-06-04 DIAGNOSIS — Z8711 Personal history of peptic ulcer disease: Secondary | ICD-10-CM | POA: Insufficient documentation

## 2013-06-04 DIAGNOSIS — B9789 Other viral agents as the cause of diseases classified elsewhere: Secondary | ICD-10-CM | POA: Insufficient documentation

## 2013-06-04 DIAGNOSIS — R0789 Other chest pain: Secondary | ICD-10-CM | POA: Insufficient documentation

## 2013-06-04 DIAGNOSIS — R1012 Left upper quadrant pain: Secondary | ICD-10-CM | POA: Insufficient documentation

## 2013-06-04 DIAGNOSIS — E669 Obesity, unspecified: Secondary | ICD-10-CM | POA: Insufficient documentation

## 2013-06-04 DIAGNOSIS — IMO0001 Reserved for inherently not codable concepts without codable children: Secondary | ICD-10-CM | POA: Insufficient documentation

## 2013-06-04 DIAGNOSIS — R1013 Epigastric pain: Secondary | ICD-10-CM | POA: Insufficient documentation

## 2013-06-04 DIAGNOSIS — R1011 Right upper quadrant pain: Secondary | ICD-10-CM | POA: Insufficient documentation

## 2013-06-04 HISTORY — DX: Gastric ulcer, unspecified as acute or chronic, without hemorrhage or perforation: K25.9

## 2013-06-04 LAB — POCT I-STAT TROPONIN I: Troponin i, poc: 0 ng/mL (ref 0.00–0.08)

## 2013-06-04 LAB — URINE MICROSCOPIC-ADD ON

## 2013-06-04 LAB — HEPATIC FUNCTION PANEL
ALBUMIN: 3.5 g/dL (ref 3.5–5.2)
ALT: 11 U/L (ref 0–35)
AST: 16 U/L (ref 0–37)
Alkaline Phosphatase: 48 U/L (ref 39–117)
Bilirubin, Direct: >0.2 mg/dL (ref 0.0–0.3)
Total Bilirubin: 0.2 mg/dL — ABNORMAL LOW (ref 0.3–1.2)
Total Protein: 7.8 g/dL (ref 6.0–8.3)

## 2013-06-04 LAB — URINALYSIS, ROUTINE W REFLEX MICROSCOPIC
Bilirubin Urine: NEGATIVE
GLUCOSE, UA: NEGATIVE mg/dL
Hgb urine dipstick: NEGATIVE
Ketones, ur: NEGATIVE mg/dL
NITRITE: NEGATIVE
PH: 5 (ref 5.0–8.0)
Protein, ur: NEGATIVE mg/dL
Specific Gravity, Urine: 1.02 (ref 1.005–1.030)
Urobilinogen, UA: 0.2 mg/dL (ref 0.0–1.0)

## 2013-06-04 LAB — BASIC METABOLIC PANEL
BUN: 9 mg/dL (ref 6–23)
CO2: 23 meq/L (ref 19–32)
Calcium: 8.7 mg/dL (ref 8.4–10.5)
Chloride: 103 mEq/L (ref 96–112)
Creatinine, Ser: 0.79 mg/dL (ref 0.50–1.10)
GFR calc Af Amer: >90 mL/min (ref >90)
GFR calc non Af Amer: >90 mL/min (ref >90)
GLUCOSE: 86 mg/dL (ref 70–99)
POTASSIUM: 4.3 meq/L (ref 3.7–5.3)
SODIUM: 138 meq/L (ref 137–147)

## 2013-06-04 LAB — CBC
HEMATOCRIT: 37.8 % (ref 36.0–46.0)
Hemoglobin: 12 g/dL (ref 12.0–15.0)
MCH: 23.2 pg — ABNORMAL LOW (ref 26.0–34.0)
MCHC: 31.7 g/dL (ref 30.0–36.0)
MCV: 73.1 fL — ABNORMAL LOW (ref 78.0–100.0)
Platelets: 349 10*3/uL (ref 150–400)
RBC: 5.17 MIL/uL — AB (ref 3.87–5.11)
RDW: 16.4 % — ABNORMAL HIGH (ref 11.5–15.5)
WBC: 9.8 10*3/uL (ref 4.0–10.5)

## 2013-06-04 LAB — LIPASE, BLOOD: Lipase: 28 U/L (ref 11–59)

## 2013-06-04 MED ORDER — OMEPRAZOLE 20 MG PO CPDR: 20.0000 mg | DELAYED_RELEASE_CAPSULE | Freq: Every day | ORAL | Status: DC

## 2013-06-04 MED ORDER — GI COCKTAIL ~~LOC~~
30.0000 mL | Freq: Once | ORAL | Status: DC
  Filled 2013-06-04: qty 30

## 2013-06-04 MED ORDER — ONDANSETRON 4 MG PO TBDP
8.0000 mg | ORAL_TABLET | Freq: Once | ORAL | Status: AC
  Administered 2013-06-04: 8 mg via ORAL
  Filled 2013-06-04: qty 2

## 2013-06-04 MED ORDER — FAMOTIDINE 20 MG PO TABS: 20.0000 mg | ORAL_TABLET | Freq: Two times a day (BID) | ORAL | Status: DC

## 2013-06-04 MED ORDER — PROMETHAZINE HCL 25 MG PO TABS: 25.0000 mg | ORAL_TABLET | Freq: Four times a day (QID) | ORAL | Status: DC | PRN

## 2013-06-04 NOTE — Discharge Instructions (Signed)
You were seen and evaluated for your symptoms of nausea vomiting, fever and body aches. Your lab testing and x-ray had not shown any signs for a concerning or emergent cause of your symptoms. At this time your providers feel your symptoms are most likely caused from a viral infection and irritation of your stomach. Please use antacid medications as instructed. Drink plenty of fluids to stay hydrated. Return for a recheck of your symptoms in 2-3 days. Return sooner for any changing or worsening symptoms.     Nausea and Vomiting Nausea is a sick feeling that often comes before throwing up (vomiting). Vomiting is a reflex where stomach contents come out of your mouth. Vomiting can cause severe loss of body fluids (dehydration). Children and elderly adults can become dehydrated quickly, especially if they also have diarrhea. Nausea and vomiting are symptoms of a condition or disease. It is important to find the cause of your symptoms. CAUSES   Direct irritation of the stomach lining. This irritation can result from increased acid production (gastroesophageal reflux disease), infection, food poisoning, taking certain medicines (such as nonsteroidal anti-inflammatory drugs), alcohol use, or tobacco use.  Signals from the brain.These signals could be caused by a headache, heat exposure, an inner ear disturbance, increased pressure in the brain from injury, infection, a tumor, or a concussion, pain, emotional stimulus, or metabolic problems.  An obstruction in the gastrointestinal tract (bowel obstruction).  Illnesses such as diabetes, hepatitis, gallbladder problems, appendicitis, kidney problems, cancer, sepsis, atypical symptoms of a heart attack, or eating disorders.  Medical treatments such as chemotherapy and radiation.  Receiving medicine that makes you sleep (general anesthetic) during surgery. DIAGNOSIS Your caregiver may ask for tests to be done if the problems do not improve after a few  days. Tests may also be done if symptoms are severe or if the reason for the nausea and vomiting is not clear. Tests may include:  Urine tests.  Blood tests.  Stool tests.  Cultures (to look for evidence of infection).  X-rays or other imaging studies. Test results can help your caregiver make decisions about treatment or the need for additional tests. TREATMENT You need to stay well hydrated. Drink frequently but in small amounts.You may wish to drink water, sports drinks, clear broth, or eat frozen ice pops or gelatin dessert to help stay hydrated.When you eat, eating slowly may help prevent nausea.There are also some antinausea medicines that may help prevent nausea. HOME CARE INSTRUCTIONS   Take all medicine as directed by your caregiver.  If you do not have an appetite, do not force yourself to eat. However, you must continue to drink fluids.  If you have an appetite, eat a normal diet unless your caregiver tells you differently.  Eat a variety of complex carbohydrates (rice, wheat, potatoes, bread), lean meats, yogurt, fruits, and vegetables.  Avoid high-fat foods because they are more difficult to digest.  Drink enough water and fluids to keep your urine clear or pale yellow.  If you are dehydrated, ask your caregiver for specific rehydration instructions. Signs of dehydration may include:  Severe thirst.  Dry lips and mouth.  Dizziness.  Dark urine.  Decreasing urine frequency and amount.  Confusion.  Rapid breathing or pulse. SEEK IMMEDIATE MEDICAL CARE IF:   You have blood or brown flecks (like coffee grounds) in your vomit.  You have black or bloody stools.  You have a severe headache or stiff neck.  You are confused.  You have severe abdominal pain.  You have chest pain or trouble breathing.  You do not urinate at least once every 8 hours.  You develop cold or clammy skin.  You continue to vomit for longer than 24 to 48 hours.  You have a  fever. MAKE SURE YOU:   Understand these instructions.  Will watch your condition.  Will get help right away if you are not doing well or get worse. Document Released: 04/19/2005 Document Revised: 07/12/2011 Document Reviewed: 09/16/2010 [facility] IncExit[facility] Patient Information 2014 [place], [place].    Viral Infections A viral infection can be caused by different types of viruses.Most viral infections are not serious and resolve on their own. However, some infections may cause severe symptoms and may lead to further complications. SYMPTOMS Viruses can frequently cause:  Minor sore throat.  Aches and pains.  Headaches.  Runny nose.  Different types of rashes.  Watery eyes.  Tiredness.  Cough.  Loss of appetite.  Gastrointestinal infections, resulting in nausea, vomiting, and diarrhea. These symptoms do not respond to antibiotics because the infection is not caused by bacteria. However, you might catch a bacterial infection following the viral infection. This is sometimes called a "superinfection." Symptoms of such a bacterial infection may include:  Worsening sore throat with pus and difficulty swallowing.  Swollen neck glands.  Chills and a high or persistent fever.  Severe headache.  Tenderness over the sinuses.  Persistent overall ill feeling (malaise), muscle aches, and tiredness (fatigue).  Persistent cough.  Yellow, green, or brown mucus production with coughing. HOME CARE INSTRUCTIONS   Only take over-the-counter or prescription medicines for pain, discomfort, diarrhea, or fever as directed by your caregiver.  Drink enough water and fluids to keep your urine clear or pale yellow. Sports drinks can provide valuable electrolytes, sugars, and hydration.  Get plenty of rest and maintain proper nutrition. Soups and broths with crackers or rice are fine. SEEK IMMEDIATE MEDICAL CARE IF:   You have severe headaches, shortness of breath, chest pain, neck pain, or an  unusual rash.  You have uncontrolled vomiting, diarrhea, or you are unable to keep down fluids.  You or your child has an oral temperature above 102 F (38.9 C), not controlled by medicine.  Your baby is older than 3 months with a rectal temperature of 102 F (38.9 C) or higher.  Your baby is 683 months old or younger with a rectal temperature of 100.4 F (38 C) or higher. MAKE SURE YOU:   Understand these instructions.  Will watch your condition.  Will get help right away if you are not doing well or get worse. Document Released: 01/27/2005 Document Revised: 07/12/2011 Document Reviewed: 08/24/2010 [facility] CenterExitCare Patient Information 2014 [place]Care, [place].

## 2013-06-04 NOTE — ED Provider Notes (Signed)
Medical screening examination/treatment/procedure(s) were performed by non-physician practitioner and as supervising physician I was immediately available for consultation/collaboration.  EKG Interpretation   None         David H Yao, MD 06/04/13 2243 

## 2013-06-04 NOTE — ED Provider Notes (Signed)
CSN: [id number]     Arrival date & time 06/04/13  1803 History   First MD Initiated Contact with Patient 06/04/13 2035     Chief Complaint  Patient presents with  . Fever  . Chest Pain  . Headache   HPI  History provided by the patient. Patient is a 48 year old female with past history of peptic ulcers who presents with complaints of generalized fatigue, fevers, chills, nausea and vomiting and body aches. Symptoms first began 2 weeks ago with nausea vomiting and bodyaches. Patient reports that she started to feel somewhat improved at the end of the week but was still having some off-and-on headaches and low backache. 2 days ago she had return of her nausea and vomiting symptoms with decreased appetite. She has been able to drink some fluids but states that she cannot eat any solid foods. There has been no associated diarrhea. She does report some new dysuria without urinary frequency or hematuria. No vaginal bleeding or vaginal discharge. She has been using Tylenol for her pain and fever symptoms with some temporary improvements. Symptoms have not been associated with any cough or congestion or sore throat. She denies any recent travel or known sick contacts.    Past Medical History  Diagnosis Date  . Gastric ulcer    Past Surgical History  Procedure Laterality Date  . Cesarean section     No family history on file. History  Substance Use Topics  . Smoking status: Never Smoker   . Smokeless tobacco: Not on file  . Alcohol Use: No   OB History   Grav Para Term Preterm Abortions TAB SAB Ect Mult Living                 Review of Systems  Constitutional: Positive for fever, chills, appetite change and fatigue.  HENT: Negative for congestion and rhinorrhea.   Respiratory: Negative for cough and shortness of breath.   Gastrointestinal: Positive for nausea and vomiting. Negative for abdominal pain, diarrhea and constipation.  Genitourinary: Positive for dysuria. Negative for frequency,  hematuria, flank pain, vaginal bleeding and vaginal discharge.  Musculoskeletal: Positive for back pain and myalgias.  Neurological: Positive for headaches. Negative for dizziness, weakness, light-headedness and numbness.  All other systems reviewed and are negative.    Allergies  Other  Home Medications   Current Outpatient Rx  Name  Route  Sig  Dispense  Refill  . acetaminophen (TYLENOL) 500 MG tablet   Oral   Take 1,000 mg by mouth every 8 (eight) hours as needed for mild pain.          BP 135/79  Pulse 61  Temp(Src) 97.9 F (36.6 C) (Oral)  Resp 18  SpO2 100% Physical Exam  Nursing note and vitals reviewed. Constitutional: She is oriented to person, place, and time. She appears well-developed and well-nourished. No distress.  HENT:  Head: Normocephalic.  Mouth/Throat: Oropharynx is clear and moist.  Neck: Normal range of motion. Neck supple.  No meningeal signs  Cardiovascular: Normal rate and regular rhythm.   No murmur heard. Pulmonary/Chest: Effort normal and breath sounds normal. No respiratory distress. She has no wheezes. She has no rales.  Abdominal: Soft. There is tenderness in the right upper quadrant, epigastric area and left upper quadrant. There is no rebound, no guarding, no CVA tenderness, no tenderness at McBurney's point and negative Murphy's sign.  Patient obese.  Musculoskeletal: Normal range of motion. She exhibits no edema.       Lumbar back:  She exhibits tenderness. She exhibits no bony tenderness and no swelling.       Back:  Neurological: She is alert and oriented to person, place, and time.  Skin: Skin is warm and dry. No rash noted.  Psychiatric: She has a normal mood and affect. Her behavior is normal.    ED Course  Procedures   DIAGNOSTIC STUDIES: Oxygen Saturation is 100% on room air.  COORDINATION OF CARE:  Nursing notes reviewed. Vital signs reviewed. Initial pt interview and examination performed.   9:45 PM-patient seen and  evaluated. Patient resting appears well in no acute distress. Normal respirations and O2 sats. Patient afebrile. Discussed work up plan with pt at bedside, which includes testing, UA and chest x-ray.. Pt agrees with plan.  Patient feeling better after Zofran tolerating by mouth fluids. She continues to have some mild views upper abdominal tenderness without peritoneal signs. Lab testing unremarkable. Normal WBC. Normal LFTs and lipase. At this time no indications for additional imaging or testing. We'll plan to have patient return home with symptomatic she would nausea vomiting. She was given strict return precautions. Also advised to have a recheck of symptoms into 3 days if not improving.  Treatment plan initiated: Medications  gi cocktail (Maalox,Lidocaine,Donnatal) (not administered)  ondansetron (ZOFRAN-ODT) disintegrating tablet 8 mg (not administered)     Results for orders placed during the hospital encounter of 06/04/13  CBC      Result Value Range   WBC 9.8  4.0 - 10.5 K/uL   RBC 5.17 (*) 3.87 - 5.11 MIL/uL   Hemoglobin 12.0  12.0 - 15.0 g/dL   HCT 16.1  09.6 - 04.5 %   MCV 73.1 (*) 78.0 - 100.0 fL   MCH 23.2 (*) 26.0 - 34.0 pg   MCHC 31.7  30.0 - 36.0 g/dL   RDW 40.9 (*) 81.1 - 91.4 %   Platelets 349  150 - 400 K/uL  BASIC METABOLIC PANEL      Result Value Range   Sodium 138  137 - 147 mEq/L   Potassium 4.3  3.7 - 5.3 mEq/L   Chloride 103  96 - 112 mEq/L   CO2 23  19 - 32 mEq/L   Glucose, Bld 86  70 - 99 mg/dL   BUN 9  6 - 23 mg/dL   Creatinine, Ser 7.82  0.50 - 1.10 mg/dL   Calcium 8.7  8.4 - 95.6 mg/dL   GFR calc non Af Amer >90  >90 mL/min   GFR calc Af Amer >90  >90 mL/min  URINALYSIS, ROUTINE W REFLEX MICROSCOPIC      Result Value Range   Color, Urine YELLOW  YELLOW   APPearance CLOUDY (*) CLEAR   Specific Gravity, Urine 1.020  1.005 - 1.030   pH 5.0  5.0 - 8.0   Glucose, UA NEGATIVE  NEGATIVE mg/dL   Hgb urine dipstick NEGATIVE  NEGATIVE   Bilirubin Urine  NEGATIVE  NEGATIVE   Ketones, ur NEGATIVE  NEGATIVE mg/dL   Protein, ur NEGATIVE  NEGATIVE mg/dL   Urobilinogen, UA 0.2  0.0 - 1.0 mg/dL   Nitrite NEGATIVE  NEGATIVE   Leukocytes, UA SMALL (*) NEGATIVE  HEPATIC FUNCTION PANEL      Result Value Range   Total Protein 7.8  6.0 - 8.3 g/dL   Albumin 3.5  3.5 - 5.2 g/dL   AST 16  0 - 37 U/L   ALT 11  0 - 35 U/L   Alkaline Phosphatase 48  39 - 117 U/L   Total Bilirubin 0.2 (*) 0.3 - 1.2 mg/dL   Bilirubin, Direct >8.2  0.0 - 0.3 mg/dL   Indirect Bilirubin NOT CALCULATED  0.3 - 0.9 mg/dL  LIPASE, BLOOD      Result Value Range   Lipase 28  11 - 59 U/L  URINE MICROSCOPIC-ADD ON      Result Value Range   Squamous Epithelial / LPF RARE  RARE   WBC, UA 3-6  <3 WBC/hpf   RBC / HPF 0-2  <3 RBC/hpf   Bacteria, UA RARE  RARE   Urine-Other MUCOUS PRESENT    POCT I-STAT TROPONIN I      Result Value Range   Troponin i, poc 0.00  0.00 - 0.08 ng/mL   Comment 3              Imaging Review Dg Chest 2 View  06/04/2013   CLINICAL DATA:  Fever, chest pain and headache.  EXAM: CHEST  2 VIEW  COMPARISON:  None.  FINDINGS: The lungs are clear. Heart size is normal. No pneumothorax or pleural effusion.  IMPRESSION: No acute disease.   Electronically Signed   By: Drusilla Kanner M.D.   On: 06/04/2013 20:22    MDM   1. Nausea & vomiting   2. Viral illness        Angus Seller, PA-C 06/04/13 2240

## 2013-06-04 NOTE — ED Notes (Signed)
Pt states has been feeling bad for 2 weeks, reports headache, lower back pain, and fever, neck pain.  Pt reports chest pain that started Saturday.

## 2013-06-06 LAB — URINE CULTURE
Colony Count: NO GROWTH
Culture: NO GROWTH

## 2013-06-12 ENCOUNTER — Encounter (HOSPITAL_COMMUNITY): Payer: Self-pay | Admitting: Emergency Medicine

## 2014-04-19 IMAGING — CR DG CHEST 2V
2 series · 2 of 2 positions shown · non-contrast
Comparison: None.

CLINICAL DATA: Fever, chest pain and headache.

EXAM:
CHEST  2 VIEW

[w chest pa]
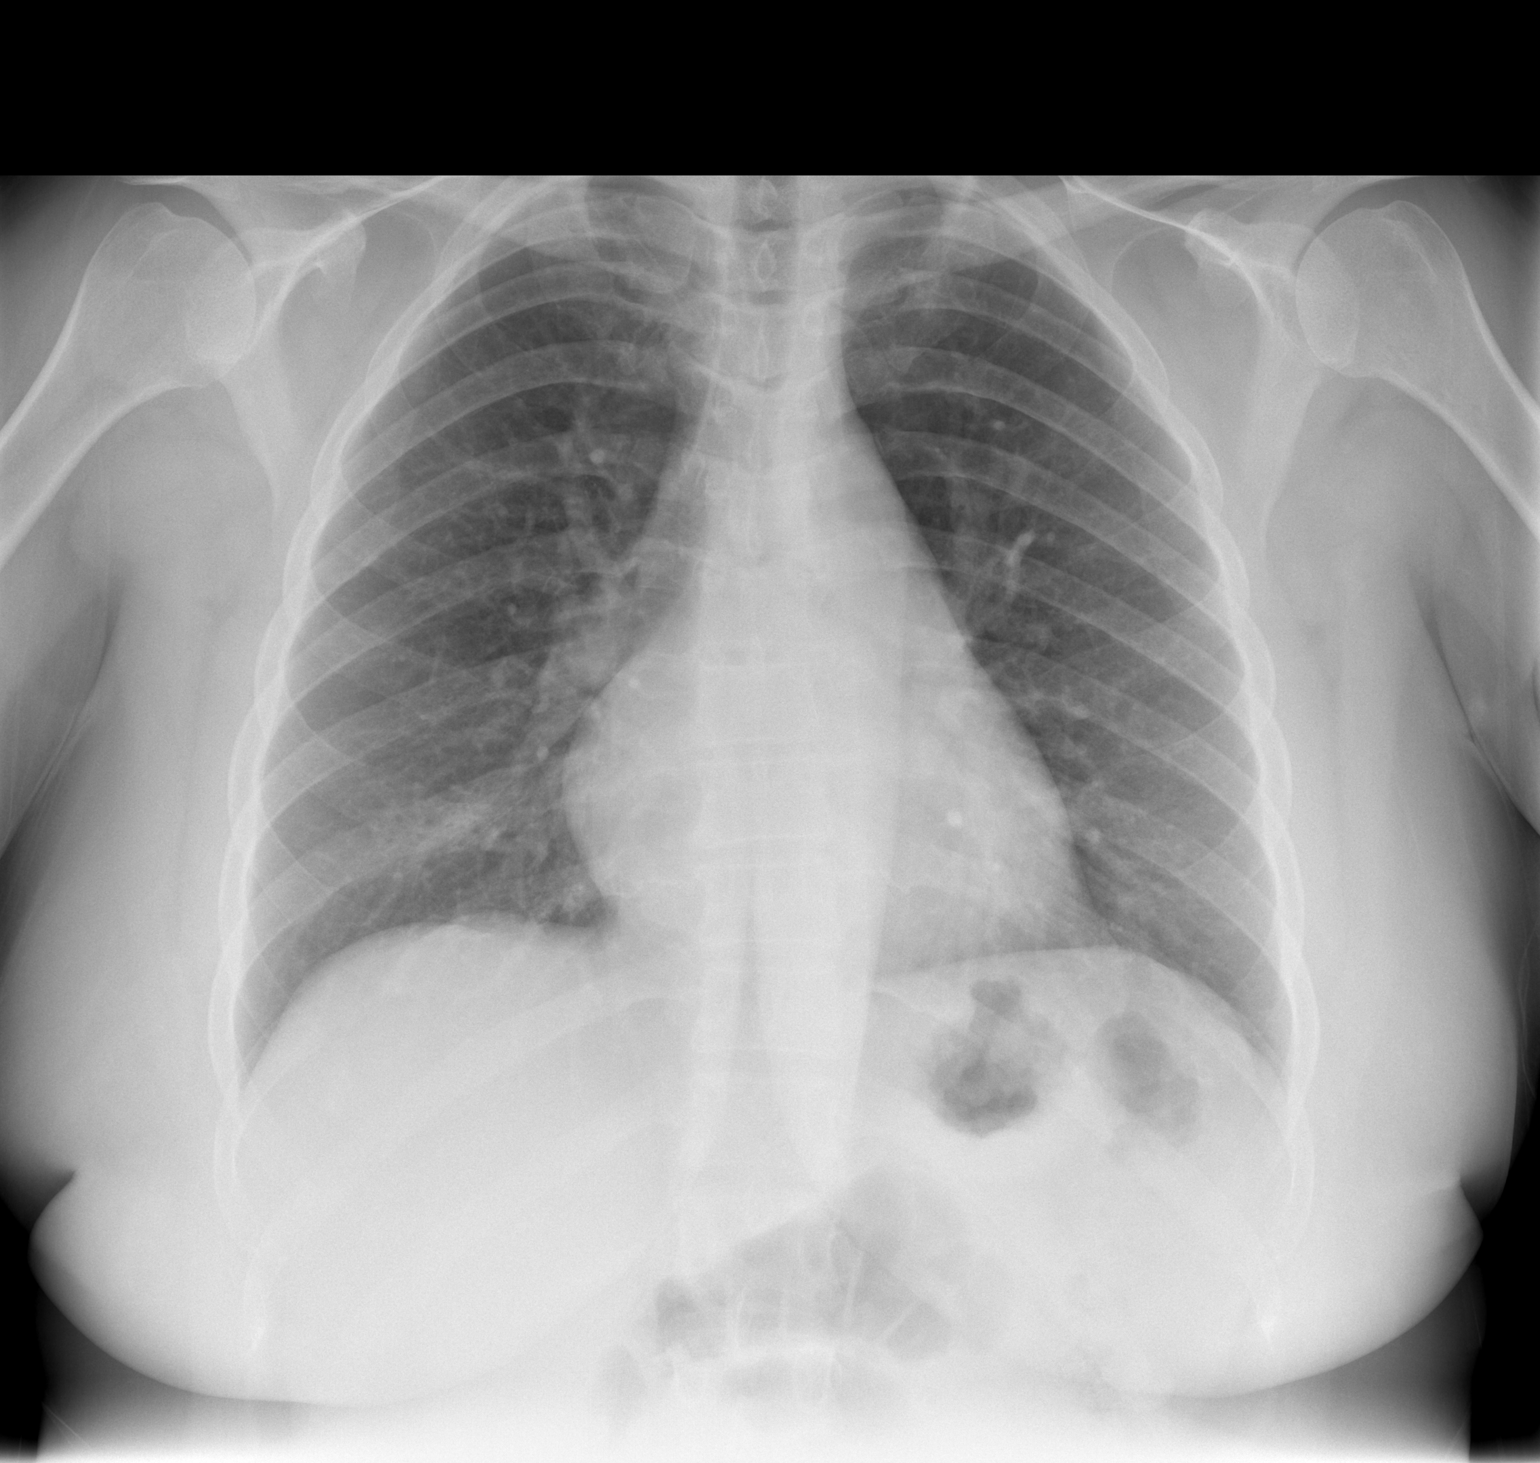

[w chest lat]
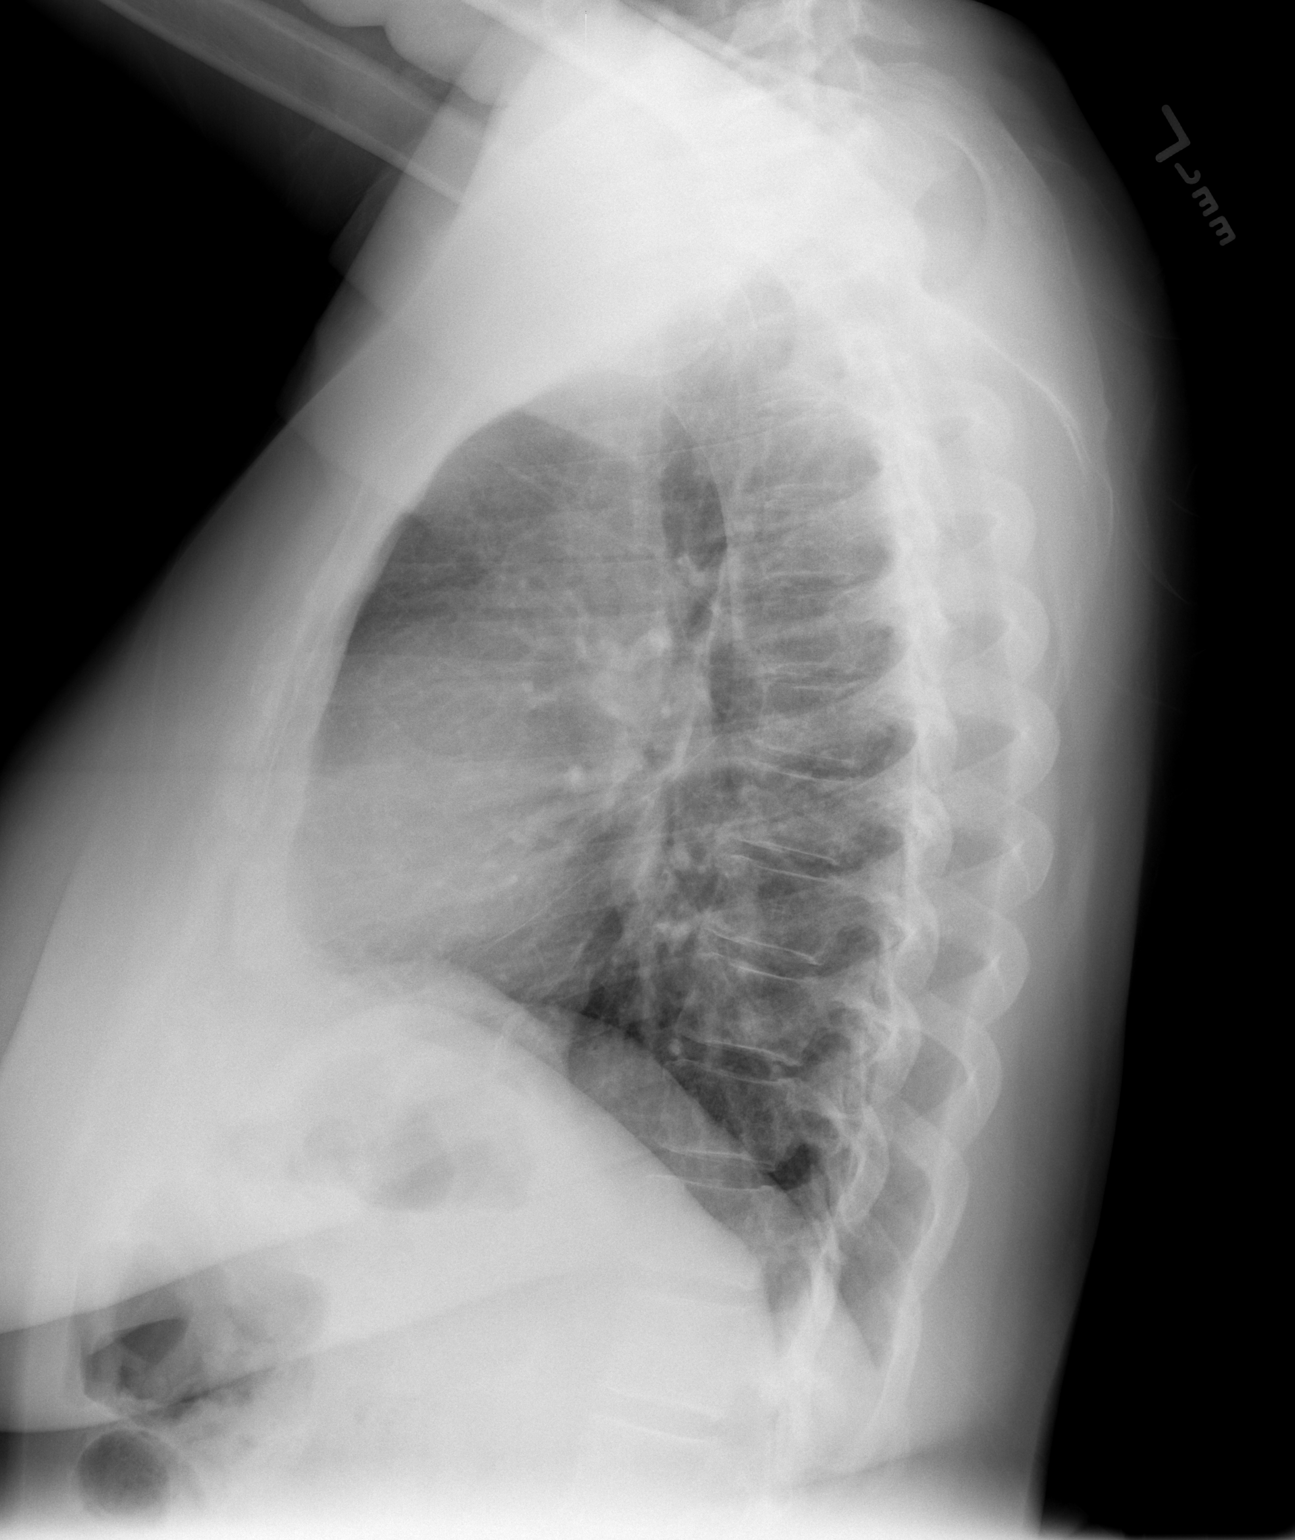

[2 of 2 positions shown; findings below may reference images not displayed]

FINDINGS: The lungs are clear. Heart size is normal. No pneumothorax or
pleural effusion.
IMPRESSION: No acute disease.

## 2014-08-19 ENCOUNTER — Other Ambulatory Visit (HOSPITAL_COMMUNITY)
Admission: RE | Admit: 2014-08-19 | Discharge: 2014-08-19 | Disposition: A | Discharge status: Home / Self Care | Payer: Self-pay | Source: Ambulatory Visit | Attending: Emergency Medicine | Admitting: Emergency Medicine

## 2014-08-19 ENCOUNTER — Encounter (HOSPITAL_COMMUNITY): Payer: Self-pay | Admitting: Emergency Medicine

## 2014-08-19 ENCOUNTER — Emergency Department (HOSPITAL_COMMUNITY)
Admission: EM | Admit: 2014-08-19 | Discharge: 2014-08-19 | Disposition: A | Discharge status: Home / Self Care | Payer: Self-pay | Source: Home / Self Care | Attending: Emergency Medicine | Admitting: Emergency Medicine

## 2014-08-19 DIAGNOSIS — Z113 Encounter for screening for infections with a predominantly sexual mode of transmission: Secondary | ICD-10-CM | POA: Insufficient documentation

## 2014-08-19 DIAGNOSIS — R1032 Left lower quadrant pain: Secondary | ICD-10-CM

## 2014-08-19 DIAGNOSIS — N760 Acute vaginitis: Secondary | ICD-10-CM | POA: Insufficient documentation

## 2014-08-19 DIAGNOSIS — N898 Other specified noninflammatory disorders of vagina: Secondary | ICD-10-CM

## 2014-08-19 NOTE — ED Provider Notes (Signed)
CSN: [id number]     Arrival date & time 08/19/14  1115 History   First MD Initiated Contact with Patient 08/19/14 1312     Chief Complaint  Patient presents with  . Abdominal Pain   (Consider location/radiation/quality/duration/timing/severity/associated sxs/prior Treatment) HPI She is a 49 year old woman here for evaluation of abdominal pain. She states for the last 2-3 weeks she has had intermittent lower abdominal pain. This is along her C-section scar, worse on the left side. It happens maybe every other day and will last for about 30 minutes. It improves with taking Tylenol. She denies any exacerbating factors. No specific association with straining or lifting heavy objects. No nausea or vomiting. Diarrhea. Her last bowel movement was last week, but she states it is not unusual for her to go several days without a bowel movement. She is passing gas. She does state she has had increased vaginal spotting with her last 2-3 menstrual cycles. Denies vaginal discharge or pain. No new sexual partners.  Past Medical History  Diagnosis Date  . GERD (gastroesophageal reflux disease)   . Ulcer   . Gastric ulcer    Past Surgical History  Procedure Laterality Date  . Tubal ligation    . Cesarean section     No family history on file. History  Substance Use Topics  . Smoking status: Never Smoker   . Smokeless tobacco: Not on file  . Alcohol Use: No   OB History    No data available     Review of Systems  Constitutional: Negative for fever.  HENT: Negative.   Respiratory: Negative.   Cardiovascular: Negative.   Gastrointestinal: Positive for abdominal pain. Negative for nausea, vomiting, diarrhea and constipation.  Genitourinary: Positive for vaginal bleeding. Negative for dysuria and vaginal discharge.    Allergies  Other  Home Medications   Prior to Admission medications   Medication Sig Start Date End Date Taking? Authorizing Provider  acetaminophen (TYLENOL) 500 MG tablet  Take 1,000 mg by mouth every 8 (eight) hours as needed for mild pain.    Historical Provider, MD  famotidine (PEPCID) 20 MG tablet Take 1 tablet (20 mg total) by mouth 2 (two) times daily. 06/04/13   Ivonne Andrew, PA-C  naproxen (NAPROSYN) 500 MG tablet Take 1 tablet (500 mg total) by mouth 2 (two) times daily with a meal. 04/24/13   Felicie Morn, NP  omeprazole (PRILOSEC) 20 MG capsule Take 1 capsule (20 mg total) by mouth daily. 06/04/13   Ivonne Andrew, PA-C  promethazine (PHENERGAN) 25 MG tablet Take 1 tablet (25 mg total) by mouth every 6 (six) hours as needed for nausea. 06/04/13   Peter Dammen, PA-C   BP 103/64 mmHg  Pulse 54  Temp(Src) 97.1 F (36.2 C) (Oral)  Resp 14  SpO2 98%  LMP 08/12/2014 Physical Exam  Constitutional: She is oriented to person, place, and time. She appears well-developed and well-nourished. No distress.  Cardiovascular: Normal rate.   Pulmonary/Chest: Effort normal.  Abdominal: Soft. Bowel sounds are normal. She exhibits mass (Slight bulging at left aspect of C-section scar with contraction of abdominal wall muscles.). She exhibits no distension. There is tenderness (she is tender over the left aspect of her C-section scar. This is worse with contracture of her abdominal wall muscles.). There is no rebound and no guarding.  Genitourinary: There is no rash on the right labia. There is no rash on the left labia. No tenderness in the vagina. No foreign body around the vagina. Vaginal discharge (thin  white) found.  Neurological: She is alert and oriented to person, place, and time.    ED Course  Procedures (including critical care time) Labs Review Labs Reviewed  CERVICOVAGINAL ANCILLARY ONLY    Imaging Review No results found.   MDM   1. LLQ pain   2. Vaginal discharge    I'm concerned for an incisional hernia on the left side of her C-section scar. Return precautions reviewed as in after visit summary. Number given for general surgery for additional  evaluation. Swabs collected and sent for gonorrhea, Chlamydia, wet prep, trichomonas. Will treat based on results.    Charm RingsErin J Arnold Kester, MD 08/19/14 1414

## 2014-08-19 NOTE — ED Notes (Signed)
Reports low abdominal pain, intermittent pain, no nausea, no vomiting, no diarrhea, last bm was last week.  Patient describes an abnormal period, spotting for several weeks, flow varying

## 2014-08-19 NOTE — Discharge Instructions (Signed)
I am concerned that you may have a small hernia at the site of your C-section scar. You can continue to take Tylenol as needed for pain. If the pain becomes severe and constant or you develop a hard, tender lump at the site, please go to the emergency room. We collected swabs for testing. We will call if something is positive. Please make an appointment at general surgery for additional evaluation. Follow-up with your OB/GYN doctor if you continue to have spotting.

## 2014-08-19 NOTE — ED Notes (Signed)
Discharge delay-requested work note

## 2014-08-20 LAB — CERVICOVAGINAL ANCILLARY ONLY
Chlamydia: NEGATIVE
Neisseria Gonorrhea: NEGATIVE

## 2014-08-21 LAB — CERVICOVAGINAL ANCILLARY ONLY: Wet Prep (BD Affirm): POSITIVE — AB

## 2014-08-23 MED ORDER — METRONIDAZOLE 500 MG PO TABS: 500.0000 mg | ORAL_TABLET | Freq: Two times a day (BID) | ORAL | Status: DC

## 2014-08-23 NOTE — ED Notes (Signed)
Left a message on answering machine for patient to pick up a new rx at her pharmacy for a non-contagious problem identified at her last visit

## 2014-08-26 ENCOUNTER — Telehealth (HOSPITAL_COMMUNITY): Payer: Self-pay | Admitting: *Deleted

## 2014-08-26 NOTE — ED Notes (Addendum)
Gardnerella pos. Needs Flagyl. Message was left for pt. on VM on 4/22. I called to verify she got the message and is taking the medicine.  Left message.  Call 2. 08/26/2014 Pt. called back.  Pt. verified x 2 and given results.  Pt. Said she did pick up her Rx. and is taking it, but does not know about  bacterial vaginosis.  Pt.'s questions answered. Vassie MoselleYork, Wanya Bangura M 08/26/2014

## 2014-12-22 ENCOUNTER — Emergency Department (HOSPITAL_COMMUNITY)
Admission: EM | Admit: 2014-12-22 | Discharge: 2014-12-23 | Disposition: A | Discharge status: Home / Self Care | Payer: Self-pay | Attending: Emergency Medicine | Admitting: Emergency Medicine

## 2014-12-22 ENCOUNTER — Emergency Department (HOSPITAL_COMMUNITY): Payer: Self-pay

## 2014-12-22 ENCOUNTER — Encounter (HOSPITAL_COMMUNITY): Payer: Self-pay | Admitting: *Deleted

## 2014-12-22 DIAGNOSIS — M25561 Pain in right knee: Secondary | ICD-10-CM | POA: Insufficient documentation

## 2014-12-22 DIAGNOSIS — Z79899 Other long term (current) drug therapy: Secondary | ICD-10-CM | POA: Insufficient documentation

## 2014-12-22 DIAGNOSIS — R531 Weakness: Secondary | ICD-10-CM | POA: Insufficient documentation

## 2014-12-22 DIAGNOSIS — Z791 Long term (current) use of non-steroidal anti-inflammatories (NSAID): Secondary | ICD-10-CM | POA: Insufficient documentation

## 2014-12-22 DIAGNOSIS — Z792 Long term (current) use of antibiotics: Secondary | ICD-10-CM | POA: Insufficient documentation

## 2014-12-22 DIAGNOSIS — K219 Gastro-esophageal reflux disease without esophagitis: Secondary | ICD-10-CM | POA: Insufficient documentation

## 2014-12-22 NOTE — ED Provider Notes (Signed)
CSN: [id number]     Arrival date & time 12/22/14  2303 History  This chart was scribed for Terri Horseman, PA-C, working with Gwyneth Sprout, MD by Chestine Spore, ED Scribe. The patient was seen in room TR05C/TR05C at 11:50 PM.    Chief Complaint  Patient presents with  . Knee Pain      The history is provided by the patient. No language interpreter was used.    Terri Erickson is a 49 y.o. female who presents to the Emergency Department complaining of sharp right knee pain onset today. Pt notes that she fell on her right knee twice last month. Pt thinks that her walking on it frequently this past weekend at a college open house has lead to the exacerbation of her right knee pain. Pt notes that she has had one instance of her knee feeling weak and hearing a clicking noise to her right knee. Pt is having associated symptoms of mild joint swelling. She notes that she has tried tylenol with no relief of her symptoms. She denies color change, wound, rash, gait problem, and any other symptoms.   Past Medical History  Diagnosis Date  . GERD (gastroesophageal reflux disease)   . Ulcer   . Gastric ulcer    Past Surgical History  Procedure Laterality Date  . Tubal ligation    . Cesarean section     No family history on file. Social History  Substance Use Topics  . Smoking status: Never Smoker   . Smokeless tobacco: None  . Alcohol Use: No   OB History    No data available     Review of Systems  Musculoskeletal: Positive for joint swelling (mild) and arthralgias.  Skin: Negative for color change, rash and wound.      Allergies  Other  Home Medications   Prior to Admission medications   Medication Sig Start Date End Date Taking? Authorizing Provider  acetaminophen (TYLENOL) 500 MG tablet Take 1,000 mg by mouth every 8 (eight) hours as needed for mild pain.    Historical Provider, MD  famotidine (PEPCID) 20 MG tablet Take 1 tablet (20 mg total) by mouth 2 (two) times  daily. 06/04/13   Ivonne Andrew, PA-C  metroNIDAZOLE (FLAGYL) 500 MG tablet Take 1 tablet (500 mg total) by mouth 2 (two) times daily. 08/23/14   Charm Rings, MD  naproxen (NAPROSYN) 500 MG tablet Take 1 tablet (500 mg total) by mouth 2 (two) times daily with a meal. 04/24/13   Felicie Morn, NP  omeprazole (PRILOSEC) 20 MG capsule Take 1 capsule (20 mg total) by mouth daily. 06/04/13   Ivonne Andrew, PA-C  promethazine (PHENERGAN) 25 MG tablet Take 1 tablet (25 mg total) by mouth every 6 (six) hours as needed for nausea. 06/04/13   Peter Dammen, PA-C   BP 129/64 mmHg  Pulse 64  Temp(Src) 98.4 F (36.9 C) (Oral)  Resp 18  Wt 189 lb 8 oz (85.957 kg)  SpO2 99%  LMP 12/20/2014 Physical Exam  Constitutional: She is oriented to person, place, and time. She appears well-developed and well-nourished. No distress.  HENT:  Head: Normocephalic and atraumatic.  Eyes: EOM are normal.  Neck: Neck supple. No tracheal deviation present.  Cardiovascular: Normal rate.   Pulmonary/Chest: Effort normal. No respiratory distress.  Musculoskeletal: Normal range of motion.  Right knee moderately tender to palpation over the superior medial aspect, no bony abnormality or deformity, range of motion and strength is 5/5, no evidence of septic joint,  or DVT  Neurological: She is alert and oriented to person, place, and time.  Skin: Skin is warm and dry.  Psychiatric: She has a normal mood and affect. Her behavior is normal.  Nursing note and vitals reviewed.   ED Course  Procedures (including critical care time) DIAGNOSTIC STUDIES: Oxygen Saturation is 99% on RA, nl by my interpretation.    COORDINATION OF CARE: 11:54 PM Discussed treatment plan with pt at bedside and pt agreed to plan.   Labs Review Labs Reviewed - No data to display  Imaging Review Dg Knee Complete 4 Views Right  12/23/2014   CLINICAL DATA:  Right knee pain after fall. Two falls in the past month. Anterior pain and swelling.  EXAM: RIGHT  KNEE - COMPLETE 4+ VIEW  COMPARISON:  None.  FINDINGS: No fracture or dislocation. The alignment and joint spaces are maintained. Diminutive inferior patellar spurs. Small quadriceps tendon enthesophyte. No joint effusion.  IMPRESSION: No acute bony abnormality. Small inferior patellar spur consistent with very mild osteoarthritis.   Electronically Signed   By: Rubye Oaks M.D.   On: 12/23/2014 00:17   I have personally reviewed and evaluated these images and lab results as part of my medical decision-making.   EKG Interpretation None      MDM   Final diagnoses:  Knee pain, acute, right    Plain films are negative, will give knee sleeve, and recommend orthopedic follow-up. Patient understands agrees the plan. She is stable and ready for discharge.  I personally performed the services described in this documentation, which was scribed in my presence. The recorded information has been reviewed and is accurate.    Terri Horseman, PA-C 12/23/14 0045  Gwyneth Sprout, MD 12/25/14 [phone]

## 2014-12-22 NOTE — ED Notes (Signed)
Pt at xray

## 2014-12-22 NOTE — ED Notes (Signed)
Pt c/o rt knee pain and swelling after falling.

## 2014-12-23 MED ORDER — IBUPROFEN 600 MG PO TABS: 600.0000 mg | ORAL_TABLET | Freq: Four times a day (QID) | ORAL | Status: DC | PRN

## 2014-12-23 NOTE — ED Notes (Signed)
Pt stable, ambulatory, states understanding of discharge instructions 

## 2014-12-23 NOTE — Discharge Instructions (Signed)
Arthralgia °Your caregiver has diagnosed you as suffering from an arthralgia. Arthralgia means there is pain in a joint. This can come from many reasons including: °· Bruising the joint which causes soreness (inflammation) in the joint. °· Wear and tear on the joints which occur as we grow older (osteoarthritis). °· Overusing the joint. °· Various forms of arthritis. °· Infections of the joint. °Regardless of the cause of pain in your joint, most of these different pains respond to anti-inflammatory drugs and rest. The exception to this is when a joint is infected, and these cases are treated with antibiotics, if it is a bacterial infection. °HOME CARE INSTRUCTIONS  °· Rest the injured area for as long as directed by your caregiver. Then slowly start using the joint as directed by your caregiver and as the pain allows. Crutches as directed may be useful if the ankles, knees or hips are involved. If the knee was splinted or casted, continue use and care as directed. If an stretchy or elastic wrapping bandage has been applied today, it should be removed and re-applied every 3 to 4 hours. It should not be applied tightly, but firmly enough to keep swelling down. Watch toes and feet for swelling, bluish discoloration, coldness, numbness or excessive pain. If any of these problems (symptoms) occur, remove the ace bandage and re-apply more loosely. If these symptoms persist, contact your caregiver or return to this location. °· For the first 24 hours, keep the injured extremity elevated on pillows while lying down. °· Apply ice for 15-20 minutes to the sore joint every couple hours while awake for the first half day. Then 03-04 times per day for the first 48 hours. Put the ice in a plastic bag and place a towel between the bag of ice and your skin. °· Wear any splinting, casting, elastic bandage applications, or slings as instructed. °· Only take over-the-counter or prescription medicines for pain, discomfort, or fever as  directed by your caregiver. Do not use aspirin immediately after the injury unless instructed by your physician. Aspirin can cause increased bleeding and bruising of the tissues. °· If you were given crutches, continue to use them as instructed and do not resume weight bearing on the sore joint until instructed. °Persistent pain and inability to use the sore joint as directed for more than 2 to 3 days are warning signs indicating that you should see a caregiver for a follow-up visit as soon as possible. Initially, a hairline fracture (break in bone) may not be evident on X-rays. Persistent pain and swelling indicate that further evaluation, non-weight bearing or use of the joint (use of crutches or slings as instructed), or further X-rays are indicated. X-rays may sometimes not show a small fracture until a week or 10 days later. Make a follow-up appointment with your own caregiver or one to whom we have referred you. A radiologist (specialist in reading X-rays) may read your X-rays. Make sure you know how you are to obtain your X-ray results. Do not assume everything is normal if you do not hear from us. °SEEK MEDICAL CARE IF: °Bruising, swelling, or pain increases. °SEEK IMMEDIATE MEDICAL CARE IF:  °· Your fingers or toes are numb or blue. °· The pain is not responding to medications and continues to stay the same or get worse. °· The pain in your joint becomes severe. °· You develop a fever over 102° F (38.9° C). °· It becomes impossible to move or use the joint. °MAKE SURE YOU:  °·   Understand these instructions. °· Will watch your condition. °· Will get help right away if you are not doing well or get worse. °Document Released: 04/19/2005 Document Revised: 07/12/2011 Document Reviewed: 12/06/2007 °ExitCare® Patient Information ©2015 [employer]. This information is not intended to replace advice given to you by your health care provider. Make sure you discuss any questions you have with your health care  provider. °Osteoarthritis °Osteoarthritis is a disease that causes soreness and inflammation of a joint. It occurs when the cartilage at the affected joint wears down. Cartilage acts as a cushion, covering the ends of bones where they meet to form a joint. Osteoarthritis is the most common form of arthritis. It often occurs in older people. The joints affected most often by this condition include those in the: °· Ends of the fingers. °· Thumbs. °· Neck. °· Lower back. °· Knees. °· Hips. °CAUSES  °Over time, the cartilage that covers the ends of bones begins to wear away. This causes bone to rub on bone, producing pain and stiffness in the affected joints.  °RISK FACTORS °Certain factors can increase your chances of having osteoarthritis, including: °· Older age. °· Excessive body weight. °· Overuse of joints. °· Previous joint injury. °SIGNS AND SYMPTOMS  °· Pain, swelling, and stiffness in the joint. °· Over time, the joint may lose its normal shape. °· Small deposits of bone (osteophytes) may grow on the edges of the joint. °· Bits of bone or cartilage can break off and float inside the joint space. This may cause more pain and damage. °DIAGNOSIS  °Your health care provider will do a physical exam and ask about your symptoms. Various tests may be ordered, such as: °· X-rays of the affected joint. °· An MRI scan. °· Blood tests to rule out other types of arthritis. °· Joint fluid tests. This involves using a needle to draw fluid from the joint and examining the fluid under a microscope. °TREATMENT  °Goals of treatment are to control pain and improve joint function. Treatment plans may include: °· A prescribed exercise program that allows for rest and joint relief. °· A weight control plan. °· Pain relief techniques, such as: °¨ Properly applied heat and cold. °¨ Electric pulses delivered to nerve endings under the skin (transcutaneous electrical nerve stimulation [TENS]). °¨ Massage. °¨ Certain nutritional  supplements. °· Medicines to control pain, such as: °¨ Acetaminophen. °¨ Nonsteroidal anti-inflammatory drugs (NSAIDs), such as naproxen. °¨ Narcotic or central-acting agents, such as tramadol. °¨ Corticosteroids. These can be given orally or as an injection. °· Surgery to reposition the bones and relieve pain (osteotomy) or to remove loose pieces of bone and cartilage. Joint replacement may be needed in advanced states of osteoarthritis. °HOME CARE INSTRUCTIONS  °· Take medicines only as directed by your health care provider. °· Maintain a healthy weight. Follow your health care provider's instructions for weight control. This may include dietary instructions. °· Exercise as directed. Your health care provider can recommend specific types of exercise. These may include: °¨ Strengthening exercises. These are done to strengthen the muscles that support joints affected by arthritis. They can be performed with weights or with exercise bands to add resistance. °¨ Aerobic activities. These are exercises, such as brisk walking or low-impact aerobics, that get your heart pumping. °¨ Range-of-motion activities. These keep your joints limber. °¨ Balance and agility exercises. These help you maintain daily living skills. °· Rest your affected joints as directed by your health care provider. °· Keep all follow-up   visits as directed by your health care provider. °SEEK MEDICAL CARE IF:  °· Your skin turns red. °· You develop a rash in addition to your joint pain. °· You have worsening joint pain. °· You have a fever along with joint or muscle aches. °SEEK IMMEDIATE MEDICAL CARE IF: °· You have a significant loss of weight or appetite. °· You have night sweats. °FOR MORE INFORMATION  °· National Institute of Arthritis and Musculoskeletal and Skin Diseases: www.niams.nih.gov °· National Institute on Aging: www.nia.nih.gov °· American College of Rheumatology: www.rheumatology.org °Document Released: 04/19/2005 Document Revised:  09/03/2013 Document Reviewed: 12/25/2012 °ExitCare® Patient Information ©2015 [employer]. This information is not intended to replace advice given to you by your health care provider. Make sure you discuss any questions you have with your health care provider. ° °

## 2015-04-09 ENCOUNTER — Emergency Department (HOSPITAL_COMMUNITY): Payer: Self-pay

## 2015-04-09 ENCOUNTER — Encounter (HOSPITAL_COMMUNITY): Payer: Self-pay | Admitting: Emergency Medicine

## 2015-04-09 DIAGNOSIS — W108XXA Fall (on) (from) other stairs and steps, initial encounter: Secondary | ICD-10-CM | POA: Insufficient documentation

## 2015-04-09 DIAGNOSIS — Y998 Other external cause status: Secondary | ICD-10-CM | POA: Insufficient documentation

## 2015-04-09 DIAGNOSIS — K219 Gastro-esophageal reflux disease without esophagitis: Secondary | ICD-10-CM | POA: Insufficient documentation

## 2015-04-09 DIAGNOSIS — T148 Other injury of unspecified body region: Secondary | ICD-10-CM | POA: Insufficient documentation

## 2015-04-09 DIAGNOSIS — Y92009 Unspecified place in unspecified non-institutional (private) residence as the place of occurrence of the external cause: Secondary | ICD-10-CM | POA: Insufficient documentation

## 2015-04-09 DIAGNOSIS — S79921A Unspecified injury of right thigh, initial encounter: Secondary | ICD-10-CM | POA: Insufficient documentation

## 2015-04-09 DIAGNOSIS — Y9389 Activity, other specified: Secondary | ICD-10-CM | POA: Insufficient documentation

## 2015-04-09 DIAGNOSIS — S79922A Unspecified injury of left thigh, initial encounter: Secondary | ICD-10-CM | POA: Insufficient documentation

## 2015-04-09 NOTE — ED Notes (Signed)
Pt. slipped and fell while going down the stairs at home this morning , no LOC /ambulatory , reports pain at lower back , bilateral buttocks and right inner thigh pain , denies dysuria or hematuria .

## 2015-04-10 ENCOUNTER — Emergency Department (HOSPITAL_COMMUNITY): Payer: Self-pay

## 2015-04-10 ENCOUNTER — Emergency Department (HOSPITAL_COMMUNITY)
Admission: EM | Admit: 2015-04-10 | Discharge: 2015-04-10 | Disposition: A | Discharge status: Home / Self Care | Payer: Self-pay | Attending: Emergency Medicine | Admitting: Emergency Medicine

## 2015-04-10 DIAGNOSIS — T148XXA Other injury of unspecified body region, initial encounter: Secondary | ICD-10-CM

## 2015-04-10 DIAGNOSIS — W19XXXA Unspecified fall, initial encounter: Secondary | ICD-10-CM

## 2015-04-10 MED ORDER — OXYCODONE-ACETAMINOPHEN 5-325 MG PO TABS: 1.0000 | ORAL_TABLET | Freq: Four times a day (QID) | ORAL | Status: DC | PRN

## 2015-04-10 MED ORDER — IBUPROFEN 600 MG PO TABS: 600.0000 mg | ORAL_TABLET | Freq: Four times a day (QID) | ORAL | Status: DC | PRN

## 2015-04-10 MED ORDER — OXYCODONE-ACETAMINOPHEN 5-325 MG PO TABS
1.0000 | ORAL_TABLET | Freq: Once | ORAL | Status: AC
  Administered 2015-04-10: 1 via ORAL
  Filled 2015-04-10: qty 1

## 2015-04-10 NOTE — Discharge Instructions (Signed)

## 2015-04-10 NOTE — ED Notes (Signed)
Pt left with all her belongings and ambulated out of the treatment area.  

## 2015-04-10 NOTE — ED Provider Notes (Signed)
CSN: [id number]     Arrival date & time 04/09/15  2239 History  By signing my name below, I, Phillis Haggis, attest that this documentation has been prepared under the direction and in the presence of Shon Baton, MD. Electronically Signed: Phillis Haggis, ED Scribe. 04/10/2015. 1:47 AM.  Chief Complaint  Patient presents with  . Fall   The history is provided by the patient. No language interpreter was used.   HPI Comments: Makyna Niehoff is a 49 y.o. female who presents to the Emergency Department complaining of a fall onset one day ago. Pt states that she slipped going down 3 stairs in her home, landing on her bottom. She reports being ambulatory after the fall. She reports associated back pain, bilateral buttocks, and posterior bilateral thigh pain. She currently rates her pain 6/10. She reports taking ibuprofen to some relief. She denies nausea, vomiting, hitting head, dysuria, hematuria, bladder or bowel incontinence, numbness, weakness, or LOC. She denies use of anti-coagulants.   Past Medical History  Diagnosis Date  . GERD (gastroesophageal reflux disease)   . Ulcer   . Gastric ulcer    Past Surgical History  Procedure Laterality Date  . Tubal ligation    . Cesarean section     No family history on file. Social History  Substance Use Topics  . Smoking status: Never Smoker   . Smokeless tobacco: None  . Alcohol Use: No   OB History    No data available     Review of Systems  Respiratory: Negative for shortness of breath.   Cardiovascular: Negative for chest pain.  Genitourinary: Negative for dysuria and hematuria.  Musculoskeletal: Positive for back pain and arthralgias.  Neurological: Negative for syncope, weakness, numbness and headaches.  All other systems reviewed and are negative.  Allergies  Other  Home Medications   Prior to Admission medications   Medication Sig Start Date End Date Taking? Authorizing Provider  acetaminophen (TYLENOL) 500  MG tablet Take 1,000 mg by mouth every 8 (eight) hours as needed for mild pain.    Historical Provider, MD  famotidine (PEPCID) 20 MG tablet Take 1 tablet (20 mg total) by mouth 2 (two) times daily. 06/04/13   Ivonne Andrew, PA-C  ibuprofen (ADVIL,MOTRIN) 600 MG tablet Take 1 tablet (600 mg total) by mouth every 6 (six) hours as needed. 04/10/15   Shon Baton, MD  metroNIDAZOLE (FLAGYL) 500 MG tablet Take 1 tablet (500 mg total) by mouth 2 (two) times daily. 08/23/14   Charm Rings, MD  naproxen (NAPROSYN) 500 MG tablet Take 1 tablet (500 mg total) by mouth 2 (two) times daily with a meal. 04/24/13   Felicie Morn, NP  omeprazole (PRILOSEC) 20 MG capsule Take 1 capsule (20 mg total) by mouth daily. 06/04/13   Ivonne Andrew, PA-C  oxyCODONE-acetaminophen (PERCOCET/ROXICET) 5-325 MG tablet Take 1 tablet by mouth every 6 (six) hours as needed for severe pain. 04/10/15   Shon Baton, MD  promethazine (PHENERGAN) 25 MG tablet Take 1 tablet (25 mg total) by mouth every 6 (six) hours as needed for nausea. 06/04/13   Ivonne Andrew, PA-C   BP 154/81 mmHg  Pulse 59  Temp(Src) 97.4 F (36.3 C) (Oral)  Resp 22  SpO2 100%  LMP 03/11/2015 (Approximate) Physical Exam  Constitutional: She is oriented to person, place, and time. She appears well-developed and well-nourished. No distress.  HENT:  Head: Normocephalic and atraumatic.  Cardiovascular: Normal rate, regular rhythm and normal heart sounds.  No murmur heard. Pulmonary/Chest: Effort normal and breath sounds normal. No respiratory distress. She has no wheezes.  Abdominal: Soft. There is no tenderness.  Musculoskeletal:  No midline spinal tenderness, step-off, or deformity, tenderness to palpation over the coccyx, no contusions noted, no obvious deformities  Neurological: She is alert and oriented to person, place, and time.  5 out of 5 strength bilateral lower extremities, no clonus, normal reflexes  Skin: Skin is warm and dry.  Psychiatric: She  has a normal mood and affect.  Nursing note and vitals reviewed.   ED Course  Procedures (including critical care time) DIAGNOSTIC STUDIES: Oxygen Saturation is 99% on RA, normal by my interpretation.    COORDINATION OF CARE: 12:46 AM-Discussed treatment plan which includes x-ray and pain medication with pt at bedside and pt agreed to plan.    Labs Review Labs Reviewed - No data to display  Imaging Review Dg Lumbar Spine Complete  04/09/2015  CLINICAL DATA:  Fall on steps last night. Low back injury and pain. Initial encounter. EXAM: LUMBAR SPINE - COMPLETE 4+ VIEW COMPARISON:  None. FINDINGS: There is no evidence of lumbar spine fracture.  Alignment is normal. Mild degenerative disc disease is seen at L2-3 and L3-4. Mild facet DJD seen bilaterally at levels of L3-4 and L4-5. IMPRESSION: No acute findings.  Degenerative spondylosis, as described above. Electronically Signed   By: Myles RosenthalJohn  Stahl M.D.   On: 04/09/2015 23:42   Dg Sacrum/coccyx  04/10/2015  CLINICAL DATA:  49 year old female with fall and trauma to the coccyx and lower back pain. EXAM: SACRUM AND COCCYX - 2+ VIEW COMPARISON:  None. FINDINGS: There is no evidence of fracture or other focal bone lesions. IMPRESSION: No fracture. Electronically Signed   By: Elgie CollardArash  Radparvar M.D.   On: 04/10/2015 01:43   I have personally reviewed and evaluated these images and lab results as part of my medical decision-making.   EKG Interpretation None      MDM   Final diagnoses:  Fall, initial encounter  Contusion    Patient presents following a fall approximately 24 hours ago. Nontoxic on exam. No midline step-off or deformity. She is tender over the sacrum and coccyx. No obvious contusions. Patient given pain medication. She is ambulatory without signs or symptoms of cauda equina. Plain films are negative. Discussed with patient symptoms control at home. She will be discharged with a short course of pain medication.  After history,  exam, and medical workup I feel the patient has been appropriately medically screened and is safe for discharge home. Pertinent diagnoses were discussed with the patient. Patient was given return precautions.  I personally performed the services described in this documentation, which was scribed in my presence. The recorded information has been reviewed and is accurate.    Shon Batonourtney F Horton, MD 04/11/15 [id number]

## 2015-05-15 ENCOUNTER — Emergency Department (HOSPITAL_COMMUNITY): Payer: Self-pay

## 2015-05-15 ENCOUNTER — Emergency Department (HOSPITAL_COMMUNITY)
Admission: EM | Admit: 2015-05-15 | Discharge: 2015-05-15 | Disposition: A | Discharge status: Home / Self Care | Payer: Self-pay | Attending: Emergency Medicine | Admitting: Emergency Medicine

## 2015-05-15 ENCOUNTER — Encounter (HOSPITAL_COMMUNITY): Payer: Self-pay

## 2015-05-15 DIAGNOSIS — Z79899 Other long term (current) drug therapy: Secondary | ICD-10-CM | POA: Insufficient documentation

## 2015-05-15 DIAGNOSIS — Z792 Long term (current) use of antibiotics: Secondary | ICD-10-CM | POA: Insufficient documentation

## 2015-05-15 DIAGNOSIS — Z791 Long term (current) use of non-steroidal anti-inflammatories (NSAID): Secondary | ICD-10-CM | POA: Insufficient documentation

## 2015-05-15 DIAGNOSIS — M79645 Pain in left finger(s): Secondary | ICD-10-CM | POA: Insufficient documentation

## 2015-05-15 DIAGNOSIS — K219 Gastro-esophageal reflux disease without esophagitis: Secondary | ICD-10-CM | POA: Insufficient documentation

## 2015-05-15 NOTE — ED Notes (Signed)
Pt states she hurt her left wrist earlier tonight and it just hasn't gotten any better.

## 2015-05-15 NOTE — ED Provider Notes (Signed)
CSN: [id number]     Arrival date & time 05/15/15  1919 History  By signing my name below, I, Tanda Rockers, attest that this documentation has been prepared under the direction and in the presence of [employer], PA-C. Electronically Signed: Tanda Rockers, ED Scribe. 05/15/2015. 9:34 PM.   Chief Complaint  Patient presents with  . Wrist Pain   The history is provided by the patient. No language interpreter was used.     HPI Comments: Terri Erickson is a 50 y.o. female who presents to the Emergency Department complaining of gradual onset, constant, left wrist pain that began earlier today. Pt states that she was getting clothes out of the washing machine when the pain gradually came on. No injury or trauma to the wrist. Denies weakness, numbness, tingling, or any other associated symptoms.   Past Medical History  Diagnosis Date  . GERD (gastroesophageal reflux disease)   . Ulcer   . Gastric ulcer    Past Surgical History  Procedure Laterality Date  . Tubal ligation    . Cesarean section     No family history on file. Social History  Substance Use Topics  . Smoking status: Never Smoker   . Smokeless tobacco: None  . Alcohol Use: No   OB History    No data available     Review of Systems  All other systems reviewed and are negative.  Allergies  Other  Home Medications   Prior to Admission medications   Medication Sig Start Date End Date Taking? Authorizing Provider  acetaminophen (TYLENOL) 500 MG tablet Take 1,000 mg by mouth every 8 (eight) hours as needed for mild pain.    Historical Provider, MD  famotidine (PEPCID) 20 MG tablet Take 1 tablet (20 mg total) by mouth 2 (two) times daily. 06/04/13   Ivonne Andrew, PA-C  ibuprofen (ADVIL,MOTRIN) 600 MG tablet Take 1 tablet (600 mg total) by mouth every 6 (six) hours as needed. 04/10/15   Shon Baton, MD  metroNIDAZOLE (FLAGYL) 500 MG tablet Take 1 tablet (500 mg total) by mouth 2 (two) times daily. 08/23/14    Charm Rings, MD  naproxen (NAPROSYN) 500 MG tablet Take 1 tablet (500 mg total) by mouth 2 (two) times daily with a meal. 04/24/13   Felicie Morn, NP  omeprazole (PRILOSEC) 20 MG capsule Take 1 capsule (20 mg total) by mouth daily. 06/04/13   Ivonne Andrew, PA-C  oxyCODONE-acetaminophen (PERCOCET/ROXICET) 5-325 MG tablet Take 1 tablet by mouth every 6 (six) hours as needed for severe pain. 04/10/15   Shon Baton, MD  promethazine (PHENERGAN) 25 MG tablet Take 1 tablet (25 mg total) by mouth every 6 (six) hours as needed for nausea. 06/04/13   Ivonne Andrew, PA-C   Triage Vitals:  BP 117/65 mmHg  Pulse 66  Temp(Src) 97.7 F (36.5 C) (Oral)  Resp 16  Ht 5\' 4"  (1.626 m)  Wt 196 lb 8 oz (89.132 kg)  BMI 33.71 kg/m2  SpO2 99%  LMP 05/09/2015   Physical Exam  Constitutional: She is oriented to person, place, and time. She appears well-developed and well-nourished. No distress.  HENT:  Head: Normocephalic and atraumatic.  Eyes: Conjunctivae and EOM are normal.  Neck: Neck supple. No tracheal deviation present.  Cardiovascular: Normal rate.   Pulmonary/Chest: Effort normal. No respiratory distress.  Musculoskeletal: Normal range of motion. She exhibits tenderness.  Minor tenderness to palpation of the ulnar aspect of the left thumb No obvious swelling or deformity Full  active ROM of thumb Cap refill < 3 seconds Sensation intact Grip strength intact No tenderness to palpation of snuff box  Neurological: She is alert and oriented to person, place, and time.  Skin: Skin is warm and dry.  Psychiatric: She has a normal mood and affect. Her behavior is normal.  Nursing note and vitals reviewed.   ED Course  Procedures (including critical care time)  DIAGNOSTIC STUDIES: Oxygen Saturation is 99% on RA, normal by my interpretation.    COORDINATION OF CARE: 9:27 PM-Discussed treatment plan which includes RICE therapy, Ibuprofen, and splint with pt at bedside and pt agreed to plan.    Labs Review Labs Reviewed - No data to display  Imaging Review Dg Wrist Complete Left  05/15/2015  CLINICAL DATA:  Acute left wrist pain without known injury. EXAM: LEFT WRIST - COMPLETE 3+ VIEW COMPARISON:  None. FINDINGS: There is no evidence of acute fracture or dislocation. There is no evidence of arthropathy. Deformity of distal left radius is noted consistent with old fracture. Soft tissues are unremarkable. IMPRESSION: No acute abnormality seen in the left wrist. Electronically Signed   By: Lupita RaiderJames  Green Jr, M.D.   On: 05/15/2015 19:58   I have personally reviewed and evaluated these images as part of my medical decision-making.   EKG Interpretation None      MDM   Final diagnoses:  Thumb pain, left   Labs: None  Imaging: DG L Wrist- negative  Consults: None  Therapeutics: None  Discharge Meds:   Assessment/Plan: Presents with likely sprain to her thumb, she has no signs of trauma, full active range of motion, sensation intact. I personally placed her in a thumb spica splint, encouraged her to follow up with her primary care provider if symptoms continue to persist. Patient verbalized understanding and agreement today's plan. Rice instructions given.   I personally performed the services described in this documentation, which was scribed in my presence. The recorded information has been reviewed and is accurate.    Eyvonne MechanicJeffrey Dallin Mccorkel, PA-C 05/15/15 2229  Mancel BaleElliott Wentz, MD 05/16/15 1248

## 2015-05-15 NOTE — Discharge Instructions (Signed)
Please read attached information. If you experience any new or worsening signs or symptoms please return to the emergency room for evaluation. Please follow-up with your primary care provider or specialist as discussed. Please use medication prescribed only as directed and discontinue taking if you have any concerning signs or symptoms.   °

## 2015-08-31 ENCOUNTER — Emergency Department (HOSPITAL_COMMUNITY)
Admission: EM | Admit: 2015-08-31 | Discharge: 2015-08-31 | Disposition: A | Discharge status: Home / Self Care | Payer: Self-pay | Attending: Emergency Medicine | Admitting: Emergency Medicine

## 2015-08-31 ENCOUNTER — Emergency Department (HOSPITAL_COMMUNITY): Payer: Self-pay

## 2015-08-31 ENCOUNTER — Encounter (HOSPITAL_COMMUNITY): Payer: Self-pay | Admitting: *Deleted

## 2015-08-31 DIAGNOSIS — M79672 Pain in left foot: Secondary | ICD-10-CM | POA: Insufficient documentation

## 2015-08-31 DIAGNOSIS — Z792 Long term (current) use of antibiotics: Secondary | ICD-10-CM | POA: Insufficient documentation

## 2015-08-31 DIAGNOSIS — Z791 Long term (current) use of non-steroidal anti-inflammatories (NSAID): Secondary | ICD-10-CM | POA: Insufficient documentation

## 2015-08-31 DIAGNOSIS — K219 Gastro-esophageal reflux disease without esophagitis: Secondary | ICD-10-CM | POA: Insufficient documentation

## 2015-08-31 DIAGNOSIS — Z79899 Other long term (current) drug therapy: Secondary | ICD-10-CM | POA: Insufficient documentation

## 2015-08-31 MED ORDER — DIAZEPAM 5 MG PO TABS
5.0000 mg | ORAL_TABLET | Freq: Once | ORAL | Status: AC
  Administered 2015-08-31: 5 mg via ORAL
  Filled 2015-08-31: qty 1

## 2015-08-31 MED ORDER — NAPROXEN 250 MG PO TABS
500.0000 mg | ORAL_TABLET | Freq: Once | ORAL | Status: AC
  Administered 2015-08-31: 500 mg via ORAL
  Filled 2015-08-31: qty 2

## 2015-08-31 MED ORDER — NAPROXEN 500 MG PO TABS: 500.0000 mg | ORAL_TABLET | Freq: Two times a day (BID) | ORAL | Status: DC

## 2015-08-31 NOTE — Discharge Instructions (Signed)
Ms. Terri Erickson,  Nice meeting you! Please follow-up with your primary care provider. Return to the emergency department if you develop chest pain, shortness of breath, discolorations in your legs, increased swelling, new/worsening pain. Feel better soon!  S. Lane HackerNicole Kurstin Dimarzo, PA-C Musculoskeletal Pain Musculoskeletal pain is muscle and boney aches and pains. These pains can occur in any part of the body. Your caregiver may treat you without knowing the cause of the pain. They may treat you if blood or urine tests, X-rays, and other tests were normal.  CAUSES There is often not a definite cause or reason for these pains. These pains may be caused by a type of germ (virus). The discomfort may also come from overuse. Overuse includes working out too hard when your body is not fit. Boney aches also come from weather changes. Bone is sensitive to atmospheric pressure changes. HOME CARE INSTRUCTIONS   Ask when your test results will be ready. Make sure you get your test results.  Only take over-the-counter or prescription medicines for pain, discomfort, or fever as directed by your caregiver. If you were given medications for your condition, do not drive, operate machinery or power tools, or sign legal documents for 24 hours. Do not drink alcohol. Do not take sleeping pills or other medications that may interfere with treatment.  Continue all activities unless the activities cause more pain. When the pain lessens, slowly resume normal activities. Gradually increase the intensity and duration of the activities or exercise.  During periods of severe pain, bed rest may be helpful. Lay or sit in any position that is comfortable.  Putting ice on the injured area.  Put ice in a bag.  Place a towel between your skin and the bag.  Leave the ice on for 15 to 20 minutes, 3 to 4 times a day.  Follow up with your caregiver for continued problems and no reason can be found for the pain. If the pain  becomes worse or does not go away, it may be necessary to repeat tests or do additional testing. Your caregiver may need to look further for a possible cause. SEEK IMMEDIATE MEDICAL CARE IF:  You have pain that is getting worse and is not relieved by medications.  You develop chest pain that is associated with shortness or breath, sweating, feeling sick to your stomach (nauseous), or throw up (vomit).  Your pain becomes localized to the abdomen.  You develop any new symptoms that seem different or that concern you. MAKE SURE YOU:   Understand these instructions.  Will watch your condition.  Will get help right away if you are not doing well or get worse.   This information is not intended to replace advice given to you by your health care provider. Make sure you discuss any questions you have with your health care provider.   Document Released: 04/19/2005 Document Revised: 07/12/2011 Document Reviewed: 12/22/2012 Elsevier Interactive Patient Education [employer]2016 [employer]

## 2015-08-31 NOTE — ED Notes (Signed)
Pt reports pain to left foot when ambulating x 2 months. Denies injury or swelling.

## 2015-08-31 NOTE — ED Provider Notes (Signed)
CSN: [id number]     Arrival date & time 08/31/15  9147 History  By signing my name below, I, Terri Erickson, attest that this documentation has been prepared under the direction and in the presence of S. Lane Hacker, PA-C Electronically Signed: Soijett Erickson, ED Scribe. 08/31/2015. 10:38 AM.    Chief Complaint  Patient presents with  . Foot Pain      The history is provided by the patient. No language interpreter was used.    Tal Neer is a 50 y.o. female who presents to the Emergency Department complaining of left foot pain onset 2 months worsening today. Pt notes that she has a tingling sensation to the bottom of her foot. 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 Pt states that her left foot pain is worsened with ambulation and weight bearing. Denies any alleviating factors. Pt states that her left foot pain is worsened with weight bearing that occurs following rest. Pt denies injury or trauma to her left foot. Pt denies being seen for her symptoms. Pt is having associated symptoms of gait problem due to pain. She notes that she has not tried any medications for the relief of her symptoms. She denies color change, wound, rash, left foot swelling, fever, chills, CP, SOB, and any other symptoms. Denies PMHx of blood clots or surgeries.    Past Medical History  Diagnosis Date  . GERD (gastroesophageal reflux disease)   . Ulcer   . Gastric ulcer    Past Surgical History  Procedure Laterality Date  . Tubal ligation    . Cesarean section     History reviewed. No pertinent family history. Social History  Substance Use Topics  . Smoking status: Never Smoker   . Smokeless tobacco: None  . Alcohol Use: No   OB History    No data available     Review of Systems  A complete 10 system review of systems was obtained and all systems are negative except as noted in the HPI and PMH.   Allergies   Other  Home Medications   Prior to Admission medications   Medication Sig Start Date End Date Taking? Authorizing Provider  acetaminophen (TYLENOL) 500 MG tablet Take 1,000 mg by mouth every 8 (eight) hours as needed for mild pain.    Historical Provider, MD  famotidine (PEPCID) 20 MG tablet Take 1 tablet (20 mg total) by mouth 2 (two) times daily. 06/04/13   Ivonne Andrew, PA-C  ibuprofen (ADVIL,MOTRIN) 600 MG tablet Take 1 tablet (600 mg total) by mouth every 6 (six) hours as needed. 04/10/15   Shon Baton, MD  metroNIDAZOLE (FLAGYL) 500 MG tablet Take 1 tablet (500 mg total) by mouth 2 (two) times daily. 08/23/14   Charm Rings, MD  naproxen (NAPROSYN) 500 MG tablet Take 1 tablet (500 mg total) by mouth 2 (two) times daily with a meal. 04/24/13   Felicie Morn, NP  omeprazole (PRILOSEC) 20 MG capsule Take 1 capsule (20 mg total) by mouth daily. 06/04/13   Ivonne Andrew, PA-C  oxyCODONE-acetaminophen (PERCOCET/ROXICET) 5-325 MG tablet Take 1 tablet by mouth every 6 (six) hours as needed for severe pain. 04/10/15   Shon Baton, MD  promethazine (PHENERGAN) 25 MG tablet Take 1 tablet (25 mg total) by mouth every 6 (six) hours as needed for nausea. 06/04/13   Peter Dammen, PA-C   BP 108/67 mmHg  Pulse  62  Temp(Src) 98.2 F (36.8 C) (Oral)  Resp 16  SpO2 99%  LMP 07/02/2015 Physical Exam  Constitutional: She is oriented to person, place, and time. She appears well-developed and well-nourished. No distress.  HENT:  Head: Normocephalic and atraumatic.  Eyes: EOM are normal.  Neck: Neck supple.  Cardiovascular: Normal rate.   Pulmonary/Chest: Effort normal. No respiratory distress.  Abdominal: She exhibits no distension.  Musculoskeletal: Normal range of motion.       Left foot: There is tenderness. There is no swelling.  Left foot without erythema or edema. TTP at heel extending up into the arch.   Neurological: She is alert and oriented to person, place, and time.  Skin: Skin is warm  and dry.  Psychiatric: She has a normal mood and affect. Her behavior is normal.  Nursing note and vitals reviewed.   ED Course  Procedures  DIAGNOSTIC STUDIES: Oxygen Saturation is 99% on RA, nl by my interpretation.    COORDINATION OF CARE: 10:38 AM Discussed treatment plan with pt at bedside which includes left foot xray and pt agreed to plan.   Imaging Review Dg Foot Complete Left  08/31/2015  CLINICAL DATA:  Left heel pain for 2 months, worse at night. No known trauma. EXAM: LEFT FOOT - COMPLETE 3+ VIEW COMPARISON:  None. FINDINGS: There is no evidence of fracture or dislocation. There is no evidence of arthropathy or other focal bone abnormality. Soft tissues are unremarkable. Small enthesophytes noted along the dorsal and plantar margins of the posterior calcaneus. IMPRESSION: No acute findings. No evidence of degenerative osteoarthritis. No erosions or other signs of an inflammatory arthritis. Mild osseous spurring along the dorsal and plantar margins of the calcaneus. Electronically Signed   By: Bary RichardStan  Maynard M.D.   On: 08/31/2015 11:33   I have personally reviewed and evaluated these images as part of my medical decision-making.  MDM   Final diagnoses:  Foot pain, left   Left foot pain. No concern for infectious etiology.  Patient X-Ray negative for obvious fracture or dislocation.  Pt advised to follow up with PCP. Patient given ace wrap while in ED, conservative therapy recommended and discussed. Patient will be discharged home & is agreeable with above plan. Returns precautions discussed. Pt appears safe for discharge.  I personally performed the services described in this documentation, which was scribed in my presence. The recorded information has been reviewed and is accurate.   Melton KrebsSamantha Nicole Uliana Brinker, PA-C 09/05/15 [id number]  Marily MemosJason Mesner, MD 09/06/15 [phone]

## 2015-11-04 ENCOUNTER — Emergency Department (HOSPITAL_COMMUNITY): Payer: Self-pay

## 2015-11-04 ENCOUNTER — Encounter (HOSPITAL_COMMUNITY): Payer: Self-pay | Admitting: Emergency Medicine

## 2015-11-04 ENCOUNTER — Emergency Department (HOSPITAL_COMMUNITY)
Admission: EM | Admit: 2015-11-04 | Discharge: 2015-11-04 | Disposition: A | Discharge status: Home / Self Care | Payer: Self-pay | Attending: Emergency Medicine | Admitting: Emergency Medicine

## 2015-11-04 DIAGNOSIS — M7732 Calcaneal spur, left foot: Secondary | ICD-10-CM | POA: Insufficient documentation

## 2015-11-04 DIAGNOSIS — Z79899 Other long term (current) drug therapy: Secondary | ICD-10-CM | POA: Insufficient documentation

## 2015-11-04 MED ORDER — HYDROCODONE-ACETAMINOPHEN 5-325 MG PO TABS: 1.0000 | ORAL_TABLET | Freq: Four times a day (QID) | ORAL | Status: DC | PRN

## 2015-11-04 MED ORDER — PREDNISONE 10 MG PO TABS: 20.0000 mg | ORAL_TABLET | Freq: Two times a day (BID) | ORAL | Status: DC

## 2015-11-04 MED ORDER — PREDNISONE 20 MG PO TABS
40.0000 mg | ORAL_TABLET | Freq: Once | ORAL | Status: AC
  Administered 2015-11-04: 40 mg via ORAL
  Filled 2015-11-04: qty 2

## 2015-11-04 NOTE — ED Provider Notes (Signed)
CSN: [id number]     Arrival date & time 11/04/15  2031 History  By signing my name below, I, [name], attest that this documentation has been prepared under the direction and in the presence of [name], NP Electronically Signed: [name], ED Scribe. 11/04/2015. 9:49 PM.   Chief Complaint  Patient presents with  . Foot Pain     Patient is a 50 y.o. female presenting with lower extremity pain. The history is provided by the patient. No language interpreter was used.  Foot Pain This is a recurrent problem. Episode onset: 3 weeks. The problem occurs daily. The problem has not changed since onset.The symptoms are aggravated by walking and standing. Nothing relieves the symptoms. She has tried nothing for the symptoms. The treatment provided no relief.    Terri Erickson is a 50 y.o. female who presents to the Emergency Department complaining of left foot pain onset 3 weeks worsening x 2 days. Pt notes that she was seen a couple months ago in the ED and was diagnosed with a bone spur to her left foot. Pt denies following up with the orthopedist following her ED visit. Pt reports that she has had increased pain since tripping in her driveway 3 weeks ago. Pt is having associated symptoms of gait problem due to pain. She notes that she has tried heel cups without medications for the relief of her symptoms. She denies color change, wound, rash, swelling, and any other symptoms.   Per pt chart review: Pt was seen in the ED on 08/31/2015 for left foot pain. Pt had left foot imaging completed with "No acute findings. No evidence of degenerative osteoarthritis. No erosions or other signs of an inflammatory arthritis. Mild osseous spurring along the dorsal and plantar margins of the calcaneus" results. Pt was Rx naprosyn for their symptoms. Pt was informed to follow up with orthopedist for further evaluation.   Past Medical History  Diagnosis Date  . GERD (gastroesophageal reflux disease)    . Ulcer   . Gastric ulcer    Past Surgical History  Procedure Laterality Date  . Tubal ligation    . Cesarean section     History reviewed. No pertinent family history. Social History  Substance Use Topics  . Smoking status: Never Smoker   . Smokeless tobacco: None  . Alcohol Use: No   OB History    No data available     Review of Systems  Musculoskeletal: Positive for arthralgias (left foot) and gait problem (due to pain). Negative for joint swelling.  Skin: Negative for color change, rash and wound.  All other systems reviewed and are negative.     Allergies  Other  Home Medications   Prior to Admission medications   Medication Sig Start Date End Date Taking? Authorizing Provider  acetaminophen (TYLENOL) 500 MG tablet Take 1,000 mg by mouth every 8 (eight) hours as needed for mild pain.    Historical Provider, MD  famotidine (PEPCID) 20 MG tablet Take 1 tablet (20 mg total) by mouth 2 (two) times daily. 06/04/13   Ivonne AndrewPeter Dammen, PA-C  HYDROcodone-acetaminophen (NORCO) 5-325 MG tablet Take 1 tablet by mouth every 6 (six) hours as needed. 11/04/15   Hope Orlene OchM Neese, NP  naproxen (NAPROSYN) 500 MG tablet Take 1 tablet (500 mg total) by mouth 2 (two) times daily. 08/31/15   Melton KrebsSamantha Nicole Riley, PA-C  omeprazole (PRILOSEC) 20 MG capsule Take 1 capsule (20 mg total) by mouth daily. 06/04/13   Ivonne AndrewPeter Dammen,  PA-C  predniSONE (DELTASONE) 10 MG tablet Take 2 tablets (20 mg total) by mouth 2 (two) times daily with a meal. 11/04/15   Hope Orlene OchM Neese, NP   BP 130/64 mmHg  Pulse 62  Temp(Src) 98.4 F (36.9 C) (Oral)  Resp 18  Ht 5\' 4"  (1.626 m)  Wt 84.823 kg  BMI 32.08 kg/m2  SpO2 100%  LMP 11/01/2015 Physical Exam  Constitutional: She is oriented to person, place, and time. She appears well-developed and well-nourished. No distress.  HENT:  Head: Normocephalic and atraumatic.  Eyes: EOM are normal.  Neck: Neck supple.  Cardiovascular: Normal rate.   Pulses:      Dorsalis pedis  pulses are 2+ on the left side.  Dorsalis pedis pulse 2+. Adequate circulation.   Pulmonary/Chest: Effort normal. No respiratory distress.  Abdominal: She exhibits no distension.  Musculoskeletal: Normal range of motion.       Left foot: There is tenderness. There is normal range of motion, no swelling, no deformity and no laceration.  Tenderness to plantar aspect of left heel. FROM.   Neurological: She is alert and oriented to person, place, and time.  Skin: Skin is warm and dry.  Psychiatric: She has a normal mood and affect. Her behavior is normal.  Nursing note and vitals reviewed.   ED Course  Procedures (including critical care time) DIAGNOSTIC STUDIES: Oxygen Saturation is 97% on RA, nl by my interpretation.    COORDINATION OF CARE: 9:44 PM Discussed treatment plan with pt at bedside which includes left foot xray, follow up with orthopedist, prednisone Rx, pain medications, and pt agreed to plan.    Imaging Review Dg Foot Complete Left  11/04/2015  CLINICAL DATA:  Pain following twisting injury 3 weeks prior EXAM: LEFT FOOT - COMPLETE 3+ VIEW COMPARISON:  August 31, 2015 FINDINGS: Frontal, oblique, and lateral views were obtained. There is no demonstrable fracture or dislocation. The joint spaces appear normal. No erosive change. There are spurs arising from the posterior and inferior calcaneus. IMPRESSION: There are calcaneal spurs. No appreciable joint space narrowing. No fracture or dislocation. Electronically Signed   By: Bretta BangWilliam  Woodruff III M.D.   On: 11/04/2015 21:23   I have personally reviewed and evaluated these images and past ED visits as part of my medical decision-making.   MDM  50 y.o. female with chronic left foot pain and new injury 3 weeks ago stable for d/c without acute findings on x-ray. Discussed x-ray findings and need for f/u with ortho. Will start prednisone and give patient 10 hydrocodone. She agrees to call ortho for f/u.   Final diagnoses:  Heel  spur, left    Patient X-Ray negative for obvious fracture or dislocation. Pt advised to follow up with orthopedics. Patient will be discharge home with prednisone Rx and norco Rx. Conservative therapy recommended and discussed. Patient will be discharged home & is agreeable with above plan. Returns precautions discussed. Pt appears safe for discharge.  I personally performed the services described in this documentation, which was scribed in my presence. The recorded information has been reviewed and is accurate.    [address]Hope MeridianM Neese, NP 11/05/15 0007  Leta BaptistEmily Roe Nguyen, MD 11/06/15 [phone]

## 2015-11-04 NOTE — Discharge Instructions (Signed)
Heel Spur  A heel spur is a bony growth that forms on the bottom of your heel bone (calcaneus). Heel spurs are common and do not always cause pain. However, heel spurs often cause inflammation in the strong band of tissue that runs underneath the bone of your foot (plantar fascia). When this happens, you may feel pain on the bottom of your foot, near your heel.   CAUSES   The cause of heel spurs is not completely understood. They may be caused by pressure on the heel. Or, they may stem from the muscle attachments (tendons) near the spur pulling on the heel.   RISK FACTORS  You may be at risk for a heel spur if you:  · Are older than 40.  · Are overweight.  · Have wear and tear arthritis (osteoarthritis).  · Have plantar fascia inflammation.  SIGNS AND SYMPTOMS   Some people have heel spurs but no symptoms. If you do have symptoms, they may include:   · Pain in the bottom of your heel.  · Pain that is worse when you first get out of bed.  · Pain that gets worse after walking or standing.  DIAGNOSIS   Your health care provider may diagnose a heel spur based on your symptoms and a physical exam. You may also have an X-ray of your foot to check for a bony growth coming from the calcaneus.   TREATMENT  Treatment aims to relieve the pain from the heel spur. This may include:  · Stretching exercises.  · Losing weight.  · Wearing specific shoes, inserts, or orthotics for comfort and support.  · Wearing splints at night to properly position your feet.  · Taking over-the-counter medicine to relieve pain.  · Being treated with high-intensity sound waves to break up the heel spur (extracorporeal shock wave therapy).  · Getting steroid injections in your heel to reduce swelling and ease pain.  · Having surgery if your heel spur causes long-term (chronic) pain.  HOME CARE INSTRUCTIONS   · Take medicines only as directed by your health care provider.  · Ask your health care provider if you should use ice or cold packs on the  painful areas of your heel or foot.  · Avoid activities that cause you pain until you recover or as directed by your health care provider.  · Stretch before exercising or being physically active.  · Wear supportive shoes that fit well as directed by your health care provider. You might need to buy new shoes. Wearing old shoes or shoes that do not fit correctly may not provide the support that you need.  · Lose weight if your health care provider thinks you should. This can relieve pressure on your foot that may be causing pain and discomfort.  SEEK MEDICAL CARE IF:   · Your pain continues or gets worse.     This information is not intended to replace advice given to you by your health care provider. Make sure you discuss any questions you have with your health care provider.     Document Released: 05/26/2005 Document Revised: 05/10/2014 Document Reviewed: 06/20/2013  Elsevier Interactive Patient Education ©2016 [employer]vier [employer]

## 2015-11-04 NOTE — ED Notes (Signed)
Pt here with left foot pain for several months. Pt sts she was here and dx with a bone spur, and pain has not gotten any better. Pt reports increased pain since tripping in the driveway 3 weeks ago. Pt ambulatory to triage.

## 2015-11-04 NOTE — ED Notes (Signed)
Patient Alert and oriented X4. Stable and ambulatory. Patient verbalized understanding of the discharge instructions.  Patient belongings were taken by the patient.  

## 2021-03-25 ENCOUNTER — Other Ambulatory Visit: Payer: Self-pay

## 2021-03-25 ENCOUNTER — Ambulatory Visit (INDEPENDENT_AMBULATORY_CARE_PROVIDER_SITE_OTHER): Payer: Self-pay | Admitting: Obstetrics & Gynecology

## 2021-03-25 ENCOUNTER — Other Ambulatory Visit (HOSPITAL_COMMUNITY)
Admission: RE | Admit: 2021-03-25 | Discharge: 2021-03-25 | Disposition: A | Discharge status: Home / Self Care | Payer: Self-pay | Source: Ambulatory Visit | Attending: Obstetrics & Gynecology | Admitting: Obstetrics & Gynecology

## 2021-03-25 ENCOUNTER — Encounter: Payer: Self-pay | Admitting: Obstetrics & Gynecology

## 2021-03-25 VITALS — BP 120/75 | HR 59 | Ht 64.0 in | Wt 204.3 lb

## 2021-03-25 DIAGNOSIS — N950 Postmenopausal bleeding: Secondary | ICD-10-CM | POA: Insufficient documentation

## 2021-03-25 DIAGNOSIS — A5901 Trichomonal vulvovaginitis: Secondary | ICD-10-CM

## 2021-03-25 HISTORY — DX: Trichomonal vulvovaginitis: A59.01

## 2021-03-25 MED ORDER — IBUPROFEN 800 MG PO TABS
800.0000 mg | ORAL_TABLET | Freq: Once | ORAL | Status: AC
Start: 2021-03-25 — End: 2021-03-25
  Administered 2021-03-25: 800 mg via ORAL

## 2021-03-25 NOTE — Progress Notes (Signed)
GYNECOLOGY OFFICE VISIT NOTE  History:   Terri Erickson is a 55 y.o. PMP here today for evaluation of postmenopausal bleeding (PMB) that started 02/22/21 with lower abdominal pain. Became menopausal before age 57, this was first episode of PMB.  Seen in [facility] ED on 02/25/21, pelvic ultrasound showed fibroid uterus, 6 mm endometrial stripe. She was told to follow up with GYN for further evaluation of her PMB. She denies any current abnormal vaginal discharge, bleeding, pelvic pain or other concerns.    Past Medical History:  Diagnosis Date   Gastric ulcer    GERD (gastroesophageal reflux disease)    Ulcer     Past Surgical History:  Procedure Laterality Date   CESAREAN SECTION     TUBAL LIGATION      The following portions of the patient's history were reviewed and updated as appropriate: allergies, current medications, past family history, past medical history, past social history, past surgical history and problem list.   Health Maintenance:  Normal pap and negative HRHPV on 07/2019 at [facility].  Normal mammogram on 09/10/2019.   Review of Systems:  Pertinent items noted in HPI and remainder of comprehensive ROS otherwise negative.  Physical Exam:  BP 120/75   Pulse (!) 59   Ht 5\' 4"  (1.626 m)   Wt 204 lb 4.8 oz (92.7 kg)   LMP 11/01/2015   BMI 35.07 kg/m  CONSTITUTIONAL: Well-developed, well-nourished female in no acute distress.  HEENT:  Normocephalic, atraumatic. External right and left ear normal. No scleral icterus.  NECK: Normal range of motion, supple, no masses noted on observation SKIN: No rash noted. Not diaphoretic. No erythema. No pallor. MUSCULOSKELETAL: Normal range of motion. No edema noted. NEUROLOGIC: Alert and oriented to person, place, and time. Normal muscle tone coordination. No cranial nerve deficit noted. PSYCHIATRIC: Normal mood and affect. Normal behavior. Normal judgment and thought content. CARDIOVASCULAR: Normal heart rate  noted RESPIRATORY: Effort and breath sounds normal, no problems with respiration noted ABDOMEN: No masses noted. No other overt distention noted.   PELVIC: Normal appearing external genitalia; normal urethral meatus; normal appearing vaginal mucosa and cervix.  No abnormal discharge noted.  Pap smear done, cervical stenosis noted during pap smear. Normal uterine size, no other palpable masses, no uterine or adnexal tenderness. Performed in the presence of a chaperone  ENDOMETRIAL BIOPSY     The indications for endometrial biopsy were reviewed.   Risks of the biopsy including cramping, bleeding, infection, uterine perforation, inadequate specimen and need for additional procedures  were discussed. The patient states she understands and agrees to undergo procedure today. Consent was signed. Time out was performed.  During the pelvic exam, the cervix was prepped with Betadine. A single-toothed tenaculum was placed on the anterior lip of the cervix to stabilize it. The cervical os was dilated with plastic dilators. The 3 mm pipelle was introduced into the endometrial cavity without difficulty to a depth of 10cm, and a moderate amount of tissue was obtained and sent to pathology. The instruments were removed from the patient's vagina. Minimal bleeding from the cervix was noted. The patient tolerated the procedure well. Routine post-procedure instructions were given to the patient.    Imaging 02/25/2021 TRANSABDOMINAL ULTRASOUND OF PELVIS, DOPPLER ULTRASOUND OF OVARIES  (Done at [facility], copied and pasted from report in Care Everywhere) CLINICAL DATA:  Postmenopausal vaginal bleeding for 3 days. History of fibroids  TECHNIQUE: Transabdominal ultrasound examination of the pelvis was performed including evaluation of the uterus, ovaries, adnexal regions,  and pelvic cul-de-sac. Color and duplex Doppler ultrasound was utilized to evaluate blood flow to the ovaries.  COMPARISON:  CT abdomen/pelvis 02/01/2021   FINDINGS:  Uterus  Measurements: 11.7 cm x 6.0 cm x 9.2 cm = volume: 338 mL. The uterus is heterogeneous in appearance. Multiple fibroids are noted, the largest measuring up to 6.3 cm x 5.3 cm x 6.8 cm anteriorly. A partially exophytic posterior fibroid is also seen measuring up to 5.4 cm x 3.6 cm x 5.2 cm.  Endometrium  Thickness: 6 mm.  No focal abnormality visualized.  Right ovary  Measurements: 2.6 cm x 2.1 cm x 2.8 cm = volume: 8.1 mL. Normal appearance/no adnexal mass.  Left ovary  Measurements: 3.2 cm x 1.7 cm x 2.6 cm = volume: 7.4 mL. Normal appearance/no adnexal mass.  Pulsed Doppler evaluation demonstrates normal low-resistance arterial and venous waveforms in both ovaries. Apparent decreased  olor flow may be due to body habitus given presence of flow on Doppler interrogation.  Other: None.  IMPRESSION:  1. Fibroid uterus.  2. Endometrial stripe measures 6 mm. No focal lesions are identified. In the setting of post-menopausal bleeding, nonemergent endometrial sampling is indicated to exclude carcinoma. If results are benign, sonohysterogram should be considered for focal lesion work-up. (Ref: Radiological Reasoning: Algorithmic Workup of Abnormal Vaginal Bleeding with Endovaginal Sonography and Sonohysterography. AJR 2008; 258:N27-78)  Electronically Signed    By: Lesia Hausen M.D.    On: 02/25/2021 17:55     Assessment and Plan:    1. Postmenopausal bleeding Discussed etiologies of postmenopausal bleeding, concern about precancerous/hyperplasia or cancerous etiology (5 to 10% percent of cases).  However, she was reassured that endometrial atrophy and endometrial polyps/fibroids are the most common causes of postmenopausal bleeding.  Uterine bleeding in postmenopausal women is usually light and self-limited. Exclusion of cancer is the main objective; therefore, treatment is usually unnecessary once cancer has been excluded.  The primary goal in the diagnostic evaluation of  postmenopausal women with uterine bleeding is to exclude malignancy; this can include endometrial biopsy and pelvic ultrasound.   Further diagnostic evaluation is indicated for recurrent or persistent bleeding - Cytology - PAP - Surgical pathology Will follow up results and manage accordingly. Patient will be contacted with results.  Routine preventative health maintenance measures emphasized. Please refer to After Visit Summary for other counseling recommendations.   Return for any gynecologic concerns.    I spent 25 minutes dedicated to the care of this patient including pre-visit review of records, face to face time with the patient discussing her conditions and treatments and post visit orders.    Jaynie Collins, MD, FACOG Obstetrician & Gynecologist, [facility] for [employer], [facility]

## 2021-03-25 NOTE — Patient Instructions (Addendum)

## 2021-03-30 LAB — SURGICAL PATHOLOGY

## 2021-03-31 ENCOUNTER — Encounter: Payer: Self-pay | Admitting: Obstetrics & Gynecology

## 2021-03-31 ENCOUNTER — Telehealth: Payer: Self-pay | Admitting: *Deleted

## 2021-03-31 DIAGNOSIS — A5901 Trichomonal vulvovaginitis: Secondary | ICD-10-CM

## 2021-03-31 LAB — CYTOLOGY - PAP
Adequacy: ABSENT
Comment: NEGATIVE
Diagnosis: NEGATIVE
High risk HPV: NEGATIVE

## 2021-03-31 MED ORDER — METRONIDAZOLE 500 MG PO TABS: 500.0000 mg | ORAL_TABLET | Freq: Two times a day (BID) | ORAL | 0 refills | Status: DC

## 2021-03-31 NOTE — Addendum Note (Signed)
Addended by: Jaynie Collins A on: 03/31/2021 04:09 PM   Modules accepted: Orders

## 2021-03-31 NOTE — Progress Notes (Signed)
Trichomonal vaginitis diagnosed on pap smear sample.  Postmenopausal bleeding can be caused by infection, and this is an active infection.  Recommend testing for other STIs, also needs to let partner(s) know so the partner(s) can get testing and treatment. Patient and sex partner(s) should abstain from unprotected sexual activity for seven days after everyone receives appropriate treatment.  Metronidazole was prescribed for patient.  Patient can return in about 4 weeks after treatment for repeat test of cure.  Please call to inform patient of results and recommendations, and advise to pick up prescription. Please advise patient to practice safe sex at all times.  Pap smear is otherwise normal. Can repeat pap smear in 3 years or earlier if needed.   Jaynie Collins, MD

## 2021-03-31 NOTE — Telephone Encounter (Addendum)
-----   Message from Tereso Newcomer, MD sent at 03/31/2021 10:42 AM EST ----- Benign endometrial biopsy. No further intervention needed unless bleeding is recurrent/persistent. Please call to inform patient of results and recommendations.  11/29  1:40 pm  Called pt and left VM message stating that I am calling with non-urgent test result information. Please call and leave a message on nurse voicemail and we will call you back.   11/29  4:42 pm  Per second result note from Dr. Macon Large: Pt's Pap smear showed +Trichomonas. Pt should be informed that postmenopausal bleeding can be caused by infection and this is an active infection. She recommends testing for other STI's. Pt also needs to be informed of need for partner treatment and that they should abstain from unprotected sex until 7 days after they both have been treated. Rx Metronidazole for pt sent to pharmacy. Pt can return in 4 weeks for TOC test. Dr. Macon Large would also like pt to be reminded to practice safe sex @ all times. Pap smear is otherwise normal. She can repeat Pap in 3 years or earlier if needed.

## 2021-04-03 NOTE — Telephone Encounter (Signed)
Called patient, no answer- left message to call us back concerning test results.

## 2021-04-06 MED ORDER — METRONIDAZOLE 500 MG PO TABS: 500.0000 mg | ORAL_TABLET | Freq: Two times a day (BID) | ORAL | 0 refills | Status: AC

## 2021-04-06 NOTE — Telephone Encounter (Signed)
Call placed back to pt. Pt given results per Dr Macon Large for Pap results and EMB. Pt verbalized understanding and agreeable to plan of care. Pt states has not had intercourse in 2 months. Pt advised to inform partner for treatment at [facility] or PCP. Pt scheduled for trich TOC per Dr A on 05/06/2021 at 10am. Pt agreeable to date and time of appt.  Pt aware of Rx Flagyl sent to pharmacy on file. [name]

## 2021-04-13 ENCOUNTER — Telehealth: Payer: Self-pay

## 2021-04-13 NOTE — Telephone Encounter (Signed)
Pt called asking if she is suppose to take the whole bottle or just 7 days.  I advised the pt that the treatment is for twice a day for seven days.  Pt reports that the pharmacy gave her 30 tablets instead 14 tablets.  I encouraged pt to make sure that her partner is treated as well and to abstain from intercourse at least a week after both have been treated.  Pt verbalized understanding with no further questions.    Leonette Nutting  04/13/21

## 2021-05-06 ENCOUNTER — Ambulatory Visit (INDEPENDENT_AMBULATORY_CARE_PROVIDER_SITE_OTHER): Payer: Self-pay

## 2021-05-06 ENCOUNTER — Other Ambulatory Visit: Payer: Self-pay

## 2021-05-06 ENCOUNTER — Other Ambulatory Visit (HOSPITAL_COMMUNITY)
Admission: RE | Admit: 2021-05-06 | Discharge: 2021-05-06 | Disposition: A | Discharge status: Home / Self Care | Payer: Self-pay | Source: Ambulatory Visit | Attending: Family Medicine | Admitting: Family Medicine

## 2021-05-06 VITALS — BP 116/70 | HR 76 | Wt 207.2 lb

## 2021-05-06 DIAGNOSIS — A5901 Trichomonal vulvovaginitis: Secondary | ICD-10-CM

## 2021-05-06 NOTE — Progress Notes (Signed)
Patient is here today for vaginal self swab for test of cure of trichomonas. Patient reports completing Flagyl course on 04/12/21. Assisted patient with registering for MyChart. Explained results will show in MyChart when available. Explained we will contact patient with any abnormal results.   Libby RN 05/06/21

## 2021-05-07 LAB — CERVICOVAGINAL ANCILLARY ONLY
Chlamydia: NEGATIVE
Comment: NEGATIVE
Comment: NEGATIVE
Comment: NORMAL
Neisseria Gonorrhea: NEGATIVE
Trichomonas: NEGATIVE

## 2021-05-13 NOTE — Progress Notes (Signed)
Patient was assessed and managed by nursing staff during this encounter. I have reviewed the chart and agree with the documentation and plan.   Edd Arbour, MSN, CNM, [facility] 05/13/21 11:49 AM

## 2022-03-03 ENCOUNTER — Emergency Department (HOSPITAL_COMMUNITY): Payer: Commercial Managed Care - HMO

## 2022-03-03 ENCOUNTER — Emergency Department (HOSPITAL_COMMUNITY)
Admission: EM | Admit: 2022-03-03 | Discharge: 2022-03-03 | Disposition: A | Discharge status: Home / Self Care | Payer: Commercial Managed Care - HMO | Attending: Emergency Medicine | Admitting: Emergency Medicine

## 2022-03-03 ENCOUNTER — Other Ambulatory Visit: Payer: Self-pay

## 2022-03-03 ENCOUNTER — Encounter (HOSPITAL_COMMUNITY): Payer: Self-pay

## 2022-03-03 DIAGNOSIS — M722 Plantar fascial fibromatosis: Secondary | ICD-10-CM | POA: Diagnosis not present

## 2022-03-03 DIAGNOSIS — M79672 Pain in left foot: Secondary | ICD-10-CM | POA: Diagnosis present

## 2022-03-03 NOTE — ED Triage Notes (Signed)
Pt reports with pain in the bottom of her left foot since yesterday.

## 2022-03-03 NOTE — ED Provider Notes (Signed)
[facility]-EMERGENCY DEPT Provider Note   CSN: [id number] Arrival date & time: 03/03/22  1946     History  Chief Complaint  Patient presents with   Foot Pain    Terri Erickson is a 56 y.o. female. With past medical history of GERD who presents to the emergency department with foot pain.  States that since yesterday she has had ongoing left foot pain. Describes it as the plantar surface of the foot and sharp. She denies any trauma to her foot but questions if she stepped out of her truck wrong. States she works two jobs and walks a lot. Denies any swelling to the foot or previous surgeries.     Foot Pain       Home Medications Prior to Admission medications   Medication Sig Start Date End Date Taking? Authorizing Provider  naproxen (NAPROSYN) 500 MG tablet Take by mouth. Patient not taking: Reported on 05/06/2021 03/08/20   [provider]  promethazine (PHENERGAN) 25 MG tablet Take 1 tablet (25 mg total) by mouth every 6 (six) hours as needed for nausea. 06/04/13 11/04/15  Ivonne Andrew, PA-C      Allergies    Tomato and Other    Review of Systems   Review of Systems  Musculoskeletal:  Positive for gait problem.  All other systems reviewed and are negative.   Physical Exam Updated Vital Signs BP 134/80 (BP Location: Left Arm)   Pulse 61   Temp 97.9 F (36.6 C) (Oral)   Resp 17   Ht 5\' 4"  (1.626 m)   Wt 92.5 kg   LMP 11/01/2015   SpO2 95%   BMI 35.02 kg/m  Physical Exam Vitals and nursing note reviewed.  HENT:     Head: Normocephalic and atraumatic.  Eyes:     General: No scleral icterus. Pulmonary:     Effort: Pulmonary effort is normal. No respiratory distress.  Feet:     Comments: Left foot with 2+ DP pulse. Complete ROM without difficulty.  No swelling of the foot or ankle.  No erythema or ecchymosis  TTP of the plantar fascia  Skin:    General: Skin is warm and dry.     Capillary Refill: Capillary refill takes  less than 2 seconds.     Findings: No rash.  Neurological:     General: No focal deficit present.     Mental Status: She is alert and oriented to person, place, and time. Mental status is at baseline.  Psychiatric:        Mood and Affect: Mood normal.        Behavior: Behavior normal.        Thought Content: Thought content normal.        Judgment: Judgment normal.     ED Results / Procedures / Treatments   Labs (all labs ordered are listed, but only abnormal results are displayed) Labs Reviewed - No data to display  EKG None  Radiology DG Foot Complete Left  Result Date: 03/03/2022 CLINICAL DATA:  Pain EXAM: LEFT FOOT - COMPLETE 3+ VIEW COMPARISON:  None Available. FINDINGS: There is no evidence of fracture or dislocation. Posterior and plantar calcaneal spurs are present. Soft tissues are unremarkable. IMPRESSION: Negative. Electronically Signed   By: 13/05/2021 M.D.   On: 03/03/2022 20:33    Procedures Procedures    Medications Ordered in ED Medications - No data to display  ED Course/ Medical Decision Making/ A&P  Medical Decision Making Amount and/or Complexity of Data Reviewed Radiology: ordered.   This patient presents to the ED with chief complaint(s) of foot pain with pertinent past medical history of GERD which further complicates the presenting complaint. The complaint involves an extensive differential diagnosis and also carries with it a high risk of complications and morbidity.    The differential diagnosis includes fracture, subluxation, ligamentous or tendinous injury, plantar fasciitis, arthritis, gout, etc.    Additional history obtained: Additional history obtained from  none available Records reviewed previous admission documents, Care Everywhere/External Records, and Primary Care Documents  ED Course and Reassessment: 57 year old female who presents to the emergency department with left foot pain.  PE as noted  above Plain film negative. No erythema, swelling or heat concerning for septic joint. Symptoms inconsistent with a gout or arthritis.  No fracture or subluxation on imaging. Symptoms are most consistent with plantar fasciitis. Instructed on RICE therapy. Will give follow-up with podiatry if she continues to have symptoms. Instructed to use supportive shoes. Given return precautions. Feel she is otherwise safe for discharge.   Independent labs interpretation:  The following labs were independently interpreted: not indicated   Independent visualization of imaging: - I independently visualized the following imaging with scope of interpretation limited to determining acute life threatening conditions related to emergency care: XR left foot, which revealed no acute abnormalities  Consultation: - Consulted or discussed management/test interpretation w/ external professional: not indicated   Consideration for admission or further workup: not indicated  Social Determinants of health: none identified  Final Clinical Impression(s) / ED Diagnoses Final diagnoses:  Plantar fasciitis of left foot    Rx / DC Orders ED Discharge Orders     None         Mickie Hillier, PA-C 03/03/22 2255    Wyvonnia Dusky, MD 03/03/22 2258

## 2022-03-03 NOTE — Discharge Instructions (Addendum)
You were seen in the emergency department today for foot pain. You likely have plantar fasciitis. I have attached some reading to your discharge instructions on this. Please rest the foot, use ice and ibuprofen. You should also wear a supportive shoe and avoid sandals, flats, flip flops. I have given you the information for podiatry if you have ongoing symptoms.

## 2022-03-03 NOTE — ED Provider Triage Note (Signed)
Emergency Medicine Provider Triage Evaluation Note  Terri Erickson , a 56 y.o. female  was evaluated in triage.  Pt complains of left foot pain. She's unsure what caused the pain. May have stepped out of her truck wrong at work yesterday. Is having pain with walking. No known trauma..  Review of Systems  Positive: See above Negative:   Physical Exam  BP 134/80 (BP Location: Left Arm)   Pulse 61   Temp 97.9 F (36.6 C) (Oral)   Resp 17   LMP 11/01/2015   SpO2 95%  Gen:   Awake, no distress   Resp:  Normal effort  MSK:   Moves extremities without difficulty  Other:  Left foot with plantar TTP. Neurovascularly intact  Medical Decision Making  Medically screening exam initiated at 8:14 PM.  Appropriate orders placed.  Terri Erickson was informed that the remainder of the evaluation will be completed by another provider, this initial triage assessment does not replace that evaluation, and the importance of remaining in the ED until their evaluation is complete.  Will obtain xr to r/o any bony abnormality. Symptoms more concerning for plantar fasciitis.    Terri Hillier, PA-C 03/03/22 2015

## 2024-05-02 ENCOUNTER — Emergency Department (HOSPITAL_COMMUNITY)
Admission: EM | Admit: 2024-05-02 | Discharge: 2024-05-02 | Disposition: A | Discharge status: Home / Self Care | Attending: Emergency Medicine | Admitting: Emergency Medicine

## 2024-05-02 ENCOUNTER — Emergency Department (HOSPITAL_COMMUNITY)

## 2024-05-02 ENCOUNTER — Other Ambulatory Visit: Payer: Self-pay

## 2024-05-02 DIAGNOSIS — B349 Viral infection, unspecified: Secondary | ICD-10-CM | POA: Diagnosis not present

## 2024-05-02 DIAGNOSIS — H9201 Otalgia, right ear: Secondary | ICD-10-CM | POA: Diagnosis not present

## 2024-05-02 DIAGNOSIS — M940 Chondrocostal junction syndrome [Tietze]: Secondary | ICD-10-CM | POA: Diagnosis not present

## 2024-05-02 DIAGNOSIS — R059 Cough, unspecified: Secondary | ICD-10-CM | POA: Diagnosis present

## 2024-05-02 LAB — TROPONIN T, HIGH SENSITIVITY
Troponin T High Sensitivity: <15 ng/L (ref 0–19)
Troponin T High Sensitivity: <15 ng/L (ref 0–19)

## 2024-05-02 LAB — BASIC METABOLIC PANEL WITH GFR
Anion gap: 9 (ref 5–15)
BUN: 10 mg/dL (ref 6–20)
CO2: 23 mmol/L (ref 22–32)
Calcium: 9 mg/dL (ref 8.9–10.3)
Chloride: 107 mmol/L (ref 98–111)
Creatinine, Ser: 0.82 mg/dL (ref 0.44–1.00)
GFR, Estimated: >60 mL/min
Glucose, Bld: 126 mg/dL — ABNORMAL HIGH (ref 70–99)
Potassium: 4.1 mmol/L (ref 3.5–5.1)
Sodium: 139 mmol/L (ref 135–145)

## 2024-05-02 LAB — CBC
HCT: 44.2 % (ref 36.0–46.0)
Hemoglobin: 14.1 g/dL (ref 12.0–15.0)
MCH: 26 pg (ref 26.0–34.0)
MCHC: 31.9 g/dL (ref 30.0–36.0)
MCV: 81.4 fL (ref 80.0–100.0)
Platelets: 249 K/uL (ref 150–400)
RBC: 5.43 MIL/uL — ABNORMAL HIGH (ref 3.87–5.11)
RDW: 14.1 % (ref 11.5–15.5)
WBC: 10.2 K/uL (ref 4.0–10.5)
nRBC: 0 % (ref 0.0–0.2)

## 2024-05-02 MED ORDER — BENZONATATE 100 MG PO CAPS: 100.0000 mg | ORAL_CAPSULE | Freq: Three times a day (TID) | ORAL | 0 refills | Status: AC

## 2024-05-02 NOTE — ED Triage Notes (Signed)
 Patient reports central chest pain onset yesterday with mild SOB and occasional dry cough , no emesis or diaphoresis .

## 2024-05-02 NOTE — ED Provider Notes (Signed)
 " [facility] Provider Note   CSN: [id number] Arrival date & time: 05/02/24  0209     Patient presents with: Chest Pain   Terri Erickson is a 58 y.o. female.   Patient is a 58 year old female with past medical history of GERD presenting to the emergency department with chest pain and cough.  The patient states that yesterday morning she woke up with a headache and started to develop a sharp midsternal chest pain.  She states that the pain has gotten better and that she now just feels it when she coughs.  She states that she has been congested with a sore throat and with right ear pain.  She denies any nausea, vomiting or diarrhea.  She states that she does feel short of breath when she gets a coughing spell.  She denies any lower extremity swelling.  The history is provided by the patient.  Chest Pain      Prior to Admission medications  Medication Sig Start Date End Date Taking? Authorizing Provider  benzonatate (TESSALON) 100 MG capsule Take 1 capsule (100 mg total) by mouth every 8 (eight) hours. 05/02/24  Yes Ellouise, Toy Samarin K, DO  naproxen  (NAPROSYN ) 500 MG tablet Take by mouth. Patient not taking: Reported on 05/06/2021 03/08/20   [provider]  promethazine  (PHENERGAN ) 25 MG tablet Take 1 tablet (25 mg total) by mouth every 6 (six) hours as needed for nausea. 06/04/13 11/04/15  Gabe Coy, PA-C    Allergies: Tomato and Other    Review of Systems  Cardiovascular:  Positive for chest pain.    Updated Vital Signs BP (!) 133/52 (BP Location: Right Arm)   Pulse 67   Temp 98.4 F (36.9 C) (Oral)   Resp 19   LMP 11/01/2015   SpO2 100%   Physical Exam Vitals and nursing note reviewed.  Constitutional:      General: She is not in acute distress.    Appearance: She is well-developed.  HENT:     Head: Normocephalic.     Right Ear: Tympanic membrane, ear canal and external ear normal.     Left Ear: Tympanic  membrane, ear canal and external ear normal.     Nose: Congestion present.     Mouth/Throat:     Mouth: Mucous membranes are moist.     Pharynx: Oropharynx is clear.  Eyes:     Extraocular Movements: Extraocular movements intact.  Cardiovascular:     Rate and Rhythm: Normal rate and regular rhythm.     Heart sounds: Normal heart sounds.  Pulmonary:     Effort: Pulmonary effort is normal.     Breath sounds: Normal breath sounds.  Chest:     Chest wall: No tenderness.  Abdominal:     Palpations: Abdomen is soft.     Tenderness: There is no abdominal tenderness.  Musculoskeletal:        General: Normal range of motion.     Cervical back: Normal range of motion and neck supple.     Right lower leg: No edema.     Left lower leg: No edema.  Skin:    General: Skin is warm and dry.  Neurological:     General: No focal deficit present.     Mental Status: She is alert and oriented to person, place, and time.  Psychiatric:        Mood and Affect: Mood normal.        Behavior: Behavior  normal.     (all labs ordered are listed, but only abnormal results are displayed) Labs Reviewed  BASIC METABOLIC PANEL WITH GFR - Abnormal; Notable for the following components:      Result Value   Glucose, Bld 126 (*)    All other components within normal limits  CBC - Abnormal; Notable for the following components:   RBC 5.43 (*)    All other components within normal limits  TROPONIN T, HIGH SENSITIVITY  TROPONIN T, HIGH SENSITIVITY    EKG: EKG Interpretation Date/Time:  Wednesday May 02 2024 02:19:22 EST Ventricular Rate:  78 PR Interval:  160 QRS Duration:  98 QT Interval:  390 QTC Calculation: 444 R Axis:   12  Text Interpretation: Normal sinus rhythm Nonspecific ST and T wave abnormality Abnormal ECG No significant change since last tracing Confirmed by Ellouise Fine ([id number]) on 05/02/2024 10:23:32 AM  Radiology: DG Chest 2 View Result Date: 05/02/2024 EXAM: 2 VIEW(S)  XRAY OF THE CHEST 05/02/2024 02:28:00 AM COMPARISON: None available. CLINICAL HISTORY: chest pain FINDINGS: LUNGS AND PLEURA: No focal pulmonary opacity. No pleural effusion. No pneumothorax. HEART AND MEDIASTINUM: No acute abnormality of the cardiac and mediastinal silhouettes. BONES AND SOFT TISSUES: No acute osseous abnormality. IMPRESSION: 1. No acute cardiopulmonary pathology. Electronically signed by: Dorethia Molt MD 05/02/2024 03:09 AM EST RP Workstation: [id number]     Procedures   Medications Ordered in the ED - No data to display                                  Medical Decision Making This patient presents to the ED with chief complaint(s) of chest pain, cough with pertinent past medical history of GERD which further complicates the presenting complaint. The complaint involves an extensive differential diagnosis and also carries with it a high risk of complications and morbidity.    The differential diagnosis includes ACS, arrhythmia, anemia, pneumonia, pneumothorax, pulmonary edema, pleural effusion, viral syndrome, costochondritis, GERD, MSK pain  Additional history obtained: Additional history obtained from N/A Records reviewed Care Everywhere/External Records  ED Course and Reassessment: On patient's arrival she is hemodynamically stable in no acute distress.  She had EKG, labs and chest x-ray initiated in triage.  EKG showed normal sinus rhythm without acute ischemic changes and she has had a negative troponin x 2.  Remainder of labs within normal range.  Chest x-ray is without acute disease.  Suspect viral syndrome based on patient's symptoms.  She is low risk for needing any antivirals, offered a swab however patient declined.  Recommended symptomatic treatment and outpatient follow-up and was given strict return precautions.  Independent labs interpretation:  The following labs were independently interpreted: Within normal range  Independent visualization of imaging: -  I independently visualized the following imaging with scope of interpretation limited to determining acute life threatening conditions related to emergency care: Chest x-ray, which revealed no acute disease  Consultation: - Consulted or discussed management/test interpretation w/ external professional: N/A  Consideration for admission or further workup: Patient has no emergent conditions requiring admission or further work-up at this time and is stable for discharge home with primary care follow-up  Social Determinants of health: N/A    Amount and/or Complexity of Data Reviewed Labs: ordered. Radiology: ordered.       Final diagnoses:  Viral syndrome  Costochondritis    ED Discharge Orders  Ordered    benzonatate (TESSALON) 100 MG capsule  Every 8 hours        05/02/24 1233               Kingsley, Toby Breithaupt K, DO 05/02/24 1233  "

## 2024-05-02 NOTE — Discharge Instructions (Signed)
 You were seen in the emergency department for your chest pain, cough and congestion.  You likely have a viral infection and had no signs of heart attack or pneumonia on your work up.  You did not require treatment with any antiviral medications can continue symptomatic treatment with Tylenol  and Motrin  as needed for fevers and bodyaches and both can be taken up to every 6 hours.  I have given you prescription for Tessalon pearls that you can take as needed for cough.  You can also try Mucinex  or raw honey as needed for your cough and you can use a humidifier or hot steam from the shower to help with your congestion as well as over-the-counter and nasal decongestant sprays.  You can follow-up with your primary doctor in the next few days to have your symptoms rechecked.  You should return to the emergency department for significantly worsening shortness of breath, severe chest pain, repetitive vomiting or if you have any other new or concerning symptoms.

## 2024-05-10 ENCOUNTER — Encounter (HOSPITAL_COMMUNITY): Payer: Self-pay

## 2024-05-10 ENCOUNTER — Ambulatory Visit (HOSPITAL_COMMUNITY)
Admission: EM | Admit: 2024-05-10 | Discharge: 2024-05-10 | Disposition: A | Discharge status: Home / Self Care | Attending: Emergency Medicine | Admitting: Emergency Medicine

## 2024-05-10 ENCOUNTER — Ambulatory Visit (INDEPENDENT_AMBULATORY_CARE_PROVIDER_SITE_OTHER)

## 2024-05-10 DIAGNOSIS — R051 Acute cough: Secondary | ICD-10-CM | POA: Diagnosis not present

## 2024-05-10 DIAGNOSIS — J209 Acute bronchitis, unspecified: Secondary | ICD-10-CM

## 2024-05-10 MED ORDER — IPRATROPIUM-ALBUTEROL 0.5-2.5 (3) MG/3ML IN SOLN
RESPIRATORY_TRACT | Status: AC
  Filled 2024-05-10: qty 3

## 2024-05-10 MED ORDER — ALBUTEROL SULFATE HFA 108 (90 BASE) MCG/ACT IN AERS
2.0000 | INHALATION_SPRAY | Freq: Once | RESPIRATORY_TRACT | Status: AC
  Administered 2024-05-10: 2 via RESPIRATORY_TRACT

## 2024-05-10 MED ORDER — PREDNISONE 20 MG PO TABS: 40.0000 mg | ORAL_TABLET | Freq: Every day | ORAL | 0 refills | Status: AC

## 2024-05-10 MED ORDER — ALBUTEROL SULFATE HFA 108 (90 BASE) MCG/ACT IN AERS
INHALATION_SPRAY | RESPIRATORY_TRACT | Status: AC
  Filled 2024-05-10: qty 6.7

## 2024-05-10 MED ORDER — IPRATROPIUM-ALBUTEROL 0.5-2.5 (3) MG/3ML IN SOLN
3.0000 mL | Freq: Once | RESPIRATORY_TRACT | Status: AC
  Administered 2024-05-10: 3 mL via RESPIRATORY_TRACT

## 2024-05-10 NOTE — Discharge Instructions (Signed)
 Your x-ray is negative for any underlying pneumonia.  I discussed believe your symptoms are likely related to a viral bronchitis. Start taking 2 tablets of prednisone  once daily for 5 days. You can use albuterol  inhaler 1 to 2 puffs every 4-6 hours as needed for shortness of breath and wheezing. You can continue taking Tessalon  every 8 hours as needed for cough. Also recommend over-the-counter Mucinex  for cough and congestion. Make sure you are staying hydrated getting plenty of rest. Follow-up with your primary care provider or return here as needed.

## 2024-05-10 NOTE — ED Provider Notes (Signed)
 " [facility]    CSN: [id number] Arrival date & time: 05/10/24  1006      History   Chief Complaint Chief Complaint  Patient presents with   Cough   Hoarse    HPI Terri Erickson is a 59 y.o. female.   Patient presents with cough and congestion initially began on 12/27.  Patient states that she was seen in the emergency department on 12/31 and was diagnosed with a viral illness at that time and prescribed Tessalon  for cough.  Patient reports that her symptoms have worsened since then.  Patient states that she now has some left-sided chest pain with coughing.   Denies any shortness of breath, but states that she has heard some wheezing while laying down at night.  Patient reports that she has been taking the Tessalon  and over-the-counter cough and cold medication without relief.  Denies any history of asthma or COPD.  Patient also denies any fever, nausea, vomiting, diarrhea, and abdominal pain.  The history is provided by the patient and medical records.  Cough   Past Medical History:  Diagnosis Date   Gastric ulcer    GERD (gastroesophageal reflux disease)    Trichomonal vaginitis 03/25/2021   Positive on pap    There are no active problems to display for this patient.   Past Surgical History:  Procedure Laterality Date   CESAREAN SECTION     TUBAL LIGATION      OB History   No obstetric history on file.      Home Medications    Prior to Admission medications  Medication Sig Start Date End Date Taking? Authorizing Provider  predniSONE  (DELTASONE ) 20 MG tablet Take 2 tablets (40 mg total) by mouth daily for 5 days. 05/10/24 05/15/24 Yes Merrit Friesen A, NP  benzonatate  (TESSALON ) 100 MG capsule Take 1 capsule (100 mg total) by mouth every 8 (eight) hours. 05/02/24   Kingsley, Victoria K, DO  naproxen  (NAPROSYN ) 500 MG tablet Take by mouth. Patient not taking: Reported on 05/06/2021 03/08/20   [provider]  promethazine  (PHENERGAN ) 25  MG tablet Take 1 tablet (25 mg total) by mouth every 6 (six) hours as needed for nausea. 06/04/13 11/04/15  Gabe Coy, PA-C    Family History History reviewed. No pertinent family history.  Social History Social History[1]   Allergies   Tomato and Other   Review of Systems Review of Systems  Respiratory:  Positive for cough.    Per HPI  Physical Exam Triage Vital Signs ED Triage Vitals [05/10/24 1114]  Encounter Vitals Group     BP 121/78     Girls Systolic BP Percentile      Girls Diastolic BP Percentile      Boys Systolic BP Percentile      Boys Diastolic BP Percentile      Pulse Rate 71     Resp 16     Temp 98.1 F (36.7 C)     Temp Source Oral     SpO2 96 %     Weight      Height      Head Circumference      Peak Flow      Pain Score 0     Pain Loc      Pain Education      Exclude from Growth Chart    No data found.  Updated Vital Signs BP 121/78 (BP Location: Left Arm)   Pulse 71   Temp 98.1 F (  36.7 C) (Oral)   Resp 16   LMP 11/01/2015   SpO2 96%   Visual Acuity Right Eye Distance:   Left Eye Distance:   Bilateral Distance:    Right Eye Near:   Left Eye Near:    Bilateral Near:     Physical Exam Vitals and nursing note reviewed.  Constitutional:      General: She is awake. She is not in acute distress.    Appearance: Normal appearance. She is well-developed and well-groomed. She is not ill-appearing.  HENT:     Right Ear: Tympanic membrane, ear canal and external ear normal.     Left Ear: Tympanic membrane, ear canal and external ear normal.     Nose: Congestion and rhinorrhea present.     Mouth/Throat:     Mouth: Mucous membranes are moist.     Pharynx: Posterior oropharyngeal erythema present. No oropharyngeal exudate.  Cardiovascular:     Rate and Rhythm: Normal rate and regular rhythm.  Pulmonary:     Effort: Pulmonary effort is normal.     Breath sounds: Decreased air movement present. Examination of the left-upper field  reveals wheezing. Examination of the left-middle field reveals wheezing. Examination of the right-lower field reveals wheezing. Examination of the left-lower field reveals wheezing. Wheezing present.  Skin:    General: Skin is warm and dry.  Neurological:     Mental Status: She is alert.  Psychiatric:        Behavior: Behavior is cooperative.      UC Treatments / Results  Labs (all labs ordered are listed, but only abnormal results are displayed) Labs Reviewed - No data to display  EKG   Radiology DG Chest 2 View Result Date: 05/10/2024 CLINICAL DATA:  Nonproductive cough x 10 days. EXAM: CHEST - 2 VIEW COMPARISON:  May 02, 2024 FINDINGS: The heart size and mediastinal contours are within normal limits. Both lungs are clear. The visualized skeletal structures are unremarkable. IMPRESSION: No active cardiopulmonary disease. Electronically Signed   By: Suzen Dials M.D.   On: 05/10/2024 12:43    Procedures Procedures (including critical care time)  Medications Ordered in UC Medications  ipratropium-albuterol  (DUONEB) 0.5-2.5 (3) MG/3ML nebulizer solution 3 mL (3 mLs Nebulization Given 05/10/24 1145)  albuterol  (VENTOLIN  HFA) 108 (90 Base) MCG/ACT inhaler 2 puff (2 puffs Inhalation Given 05/10/24 1256)    Initial Impression / Assessment and Plan / UC Course  I have reviewed the triage vital signs and the nursing notes.  Pertinent labs & imaging results that were available during my care of the patient were reviewed by me and considered in my medical decision making (see chart for details).     Patient is overall well-appearing.  Vitals are stable.  Decreased wheezing auscultated to left lobe with minimal wheezing noted to right lower lobe.  DuoNeb given in clinic with relief of wheezing and symptomatic relief.  Chest x-ray ordered.  I independently interpreted these images and there is no active cardiopulmonary disease at this time.  Radiology report confirms this.  I  believe symptoms are likely related to a viral bronchitis.  Provided patient with albuterol  inhaler to use as needed for shortness of breath and wheezing.  Prescribed short prednisone  burst.  Discussed medications for cough.  Discussed follow-up and return precautions. Final Clinical Impressions(s) / UC Diagnoses   Final diagnoses:  Acute cough  Acute bronchitis, unspecified organism     Discharge Instructions      Your x-ray is negative for any  underlying pneumonia.  I discussed believe your symptoms are likely related to a viral bronchitis. Start taking 2 tablets of prednisone  once daily for 5 days. You can use albuterol  inhaler 1 to 2 puffs every 4-6 hours as needed for shortness of breath and wheezing. You can continue taking Tessalon  every 8 hours as needed for cough. Also recommend over-the-counter Mucinex  for cough and congestion. Make sure you are staying hydrated getting plenty of rest. Follow-up with your primary care provider or return here as needed.     ED Prescriptions     Medication Sig Dispense Auth. Provider   predniSONE  (DELTASONE ) 20 MG tablet Take 2 tablets (40 mg total) by mouth daily for 5 days. 10 tablet Johnie Flaming A, NP      PDMP not reviewed this encounter.     [1]  Social History Tobacco Use   Smoking status: Never  Vaping Use   Vaping status: Never Used  Substance Use Topics   Alcohol use: No   Drug use: No     Johnie Flaming LABOR, NP 05/10/24 1305  "

## 2024-05-10 NOTE — ED Triage Notes (Signed)
 Patient c/o a non productive cough and being hoarse x 10 days. Patient states she went to the ED 8 days ago and was prescribed Tessalon  cough pills which she said did not help her. Patient is hoarse also  Patient has also had Tylenol  Cold and Flu, Dayquil/Nyquil, hot tea with lemon and honey, and Vick's vapor rub.
